# Patient Record
Sex: Male | Born: 1965
Health system: Southern US, Community
[De-identification: ages and names within clinical notes are randomized; demographics above are authoritative.]

## PROBLEM LIST (undated history)

## (undated) DIAGNOSIS — M1712 Unilateral primary osteoarthritis, left knee: Secondary | ICD-10-CM

## (undated) DIAGNOSIS — E786 Lipoprotein deficiency: Secondary | ICD-10-CM

## (undated) DIAGNOSIS — I358 Other nonrheumatic aortic valve disorders: Secondary | ICD-10-CM

## (undated) DIAGNOSIS — E119 Type 2 diabetes mellitus without complications: Secondary | ICD-10-CM

## (undated) DIAGNOSIS — E7800 Pure hypercholesterolemia, unspecified: Secondary | ICD-10-CM

## (undated) DIAGNOSIS — I10 Essential (primary) hypertension: Secondary | ICD-10-CM

## (undated) DIAGNOSIS — N529 Male erectile dysfunction, unspecified: Secondary | ICD-10-CM

## (undated) HISTORY — DX: Male erectile dysfunction, unspecified: N52.9

## (undated) HISTORY — DX: Type 2 diabetes mellitus without complications: E11.9

## (undated) HISTORY — DX: Essential (primary) hypertension: I10

## (undated) HISTORY — DX: Pure hypercholesterolemia, unspecified: E78.00

## (undated) HISTORY — DX: Lipoprotein deficiency: E78.6

## (undated) HISTORY — DX: Other nonrheumatic aortic valve disorders: I35.8

## (undated) HISTORY — DX: Unilateral primary osteoarthritis, left knee: M17.12

---

## 2008-01-19 ENCOUNTER — Ambulatory Visit: Payer: Self-pay | Admitting: Orthopedic Surgery

## 2009-10-03 ENCOUNTER — Ambulatory Visit: Payer: Self-pay | Admitting: General Practice

## 2009-10-10 ENCOUNTER — Ambulatory Visit: Payer: Self-pay | Admitting: General Practice

## 2009-10-10 IMAGING — US US EXTREM LOW VENOUS*L*
1 series · 17 of 24 positions shown · non-contrast
Comparison: none

REASON FOR EXAM: STAT CR [PHONE_NUMBER] or [PHONE_NUMBER] swollen painful Left leg
eval DVT
COMMENTS:

[Series 1: us extrem low venous*left* · 17 of 29 slices shown]
[im 1/29]
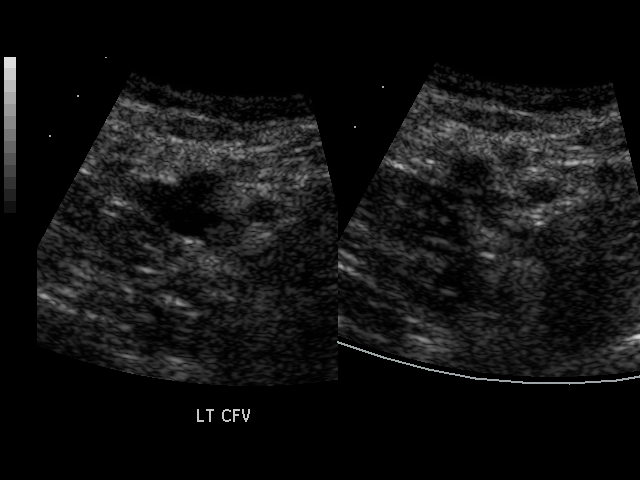
[im 3/29]
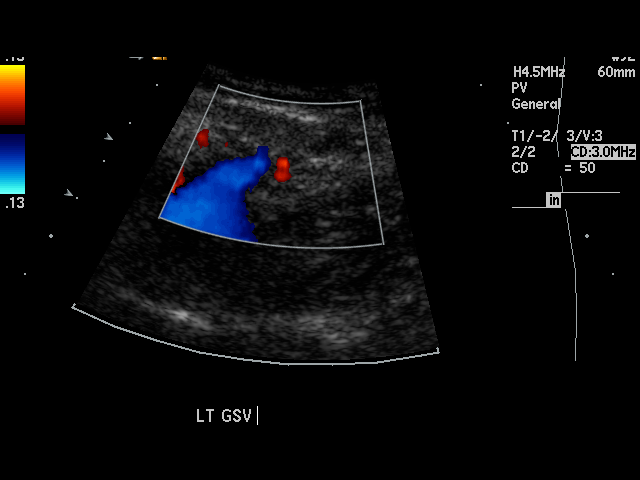
[im 4/29]
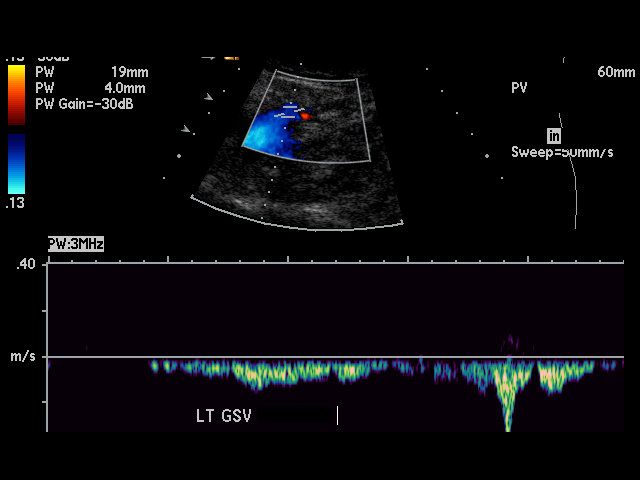
[im 5/29]
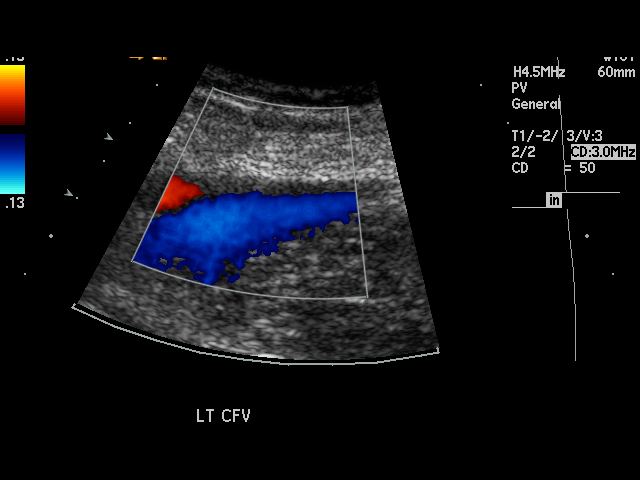
[im 8/29]
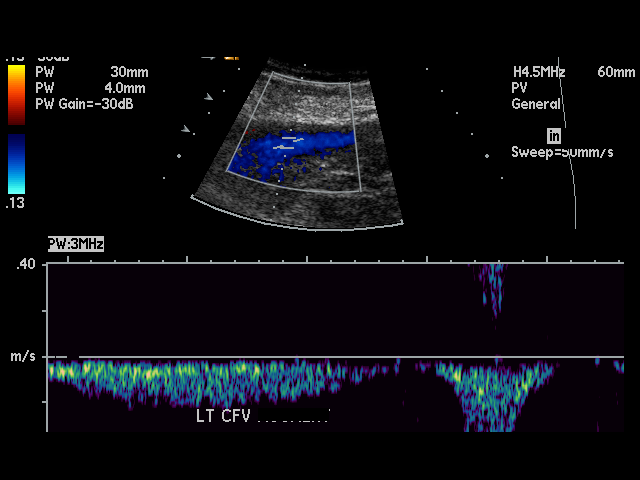
[im 9/29]
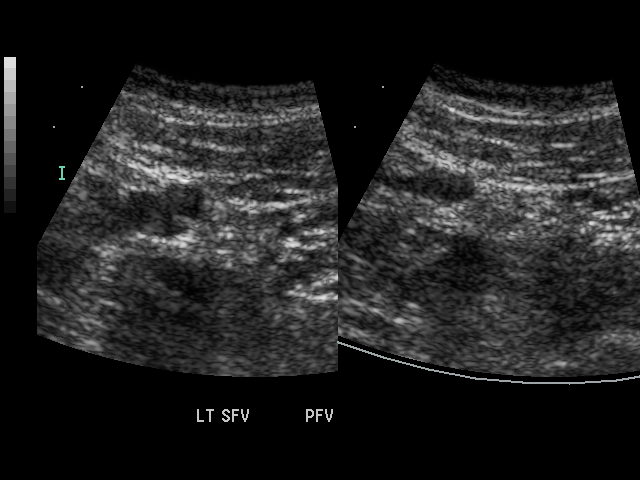
[im 11/29]
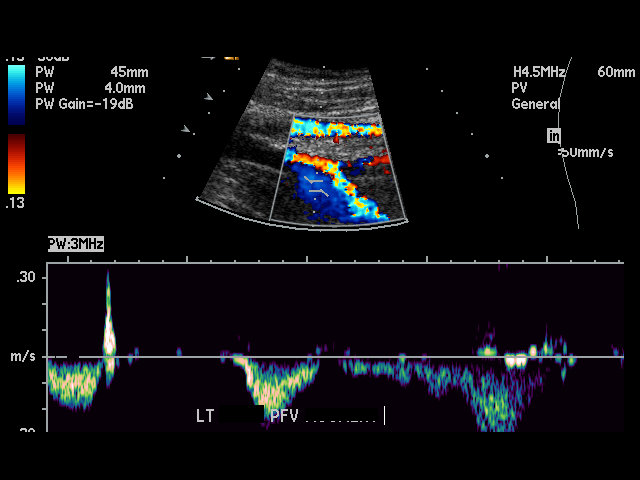
[im 13/29]
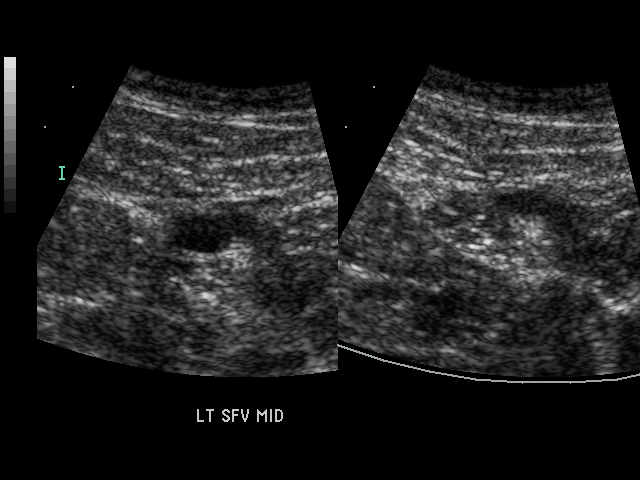
[im 15/29]
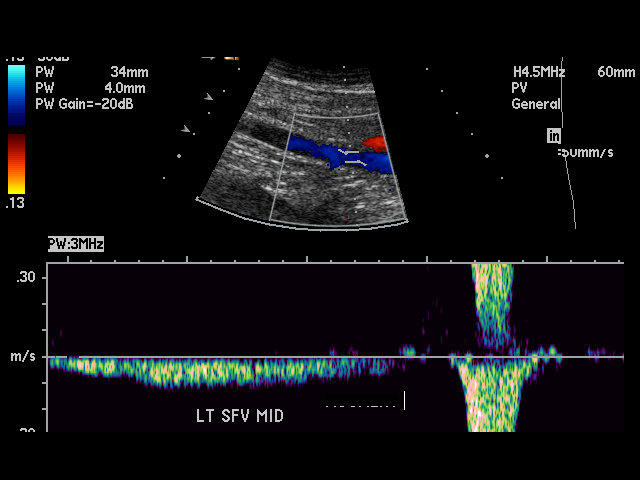
[im 16/29]
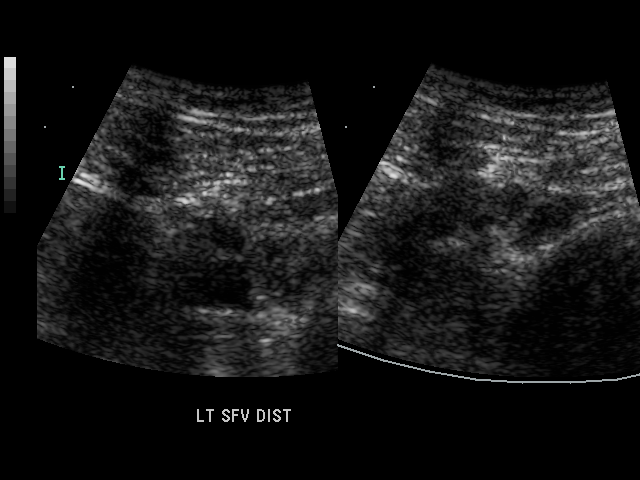
[im 18/29]
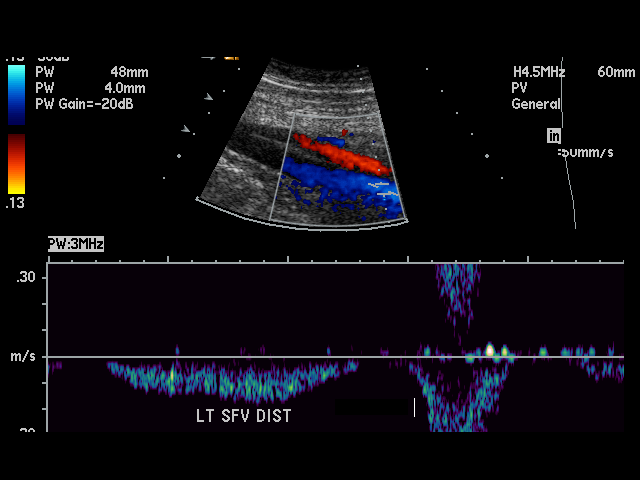
[im 20/29]
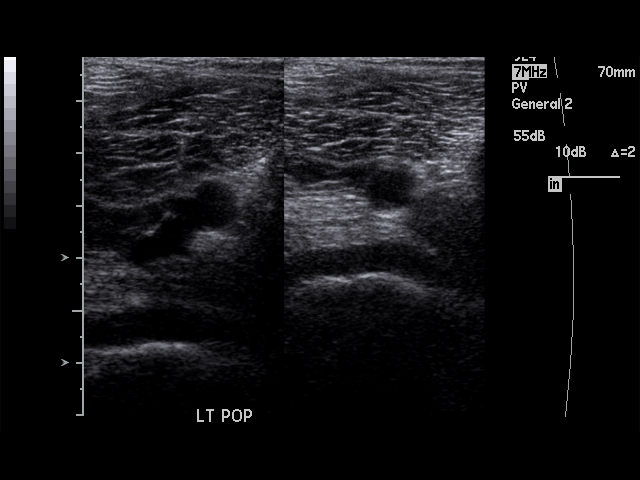
[im 21/29]
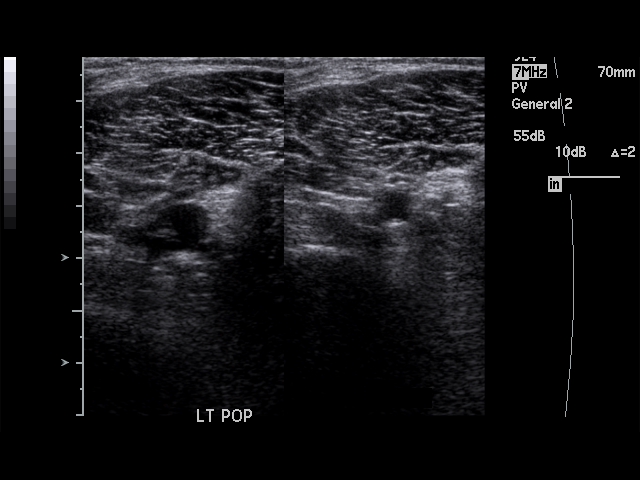
[im 24/29]
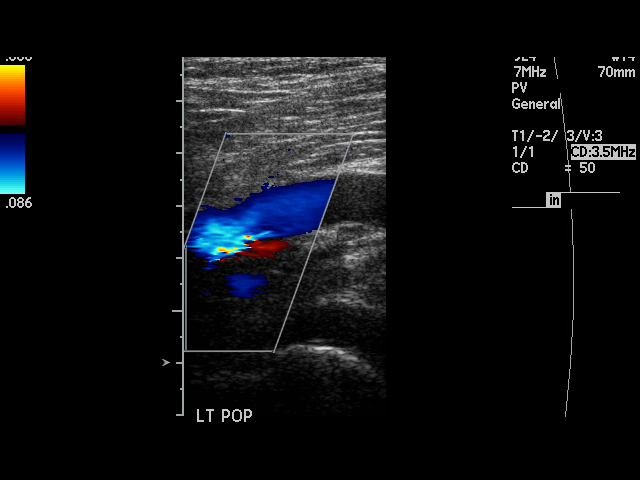
[im 25/29]
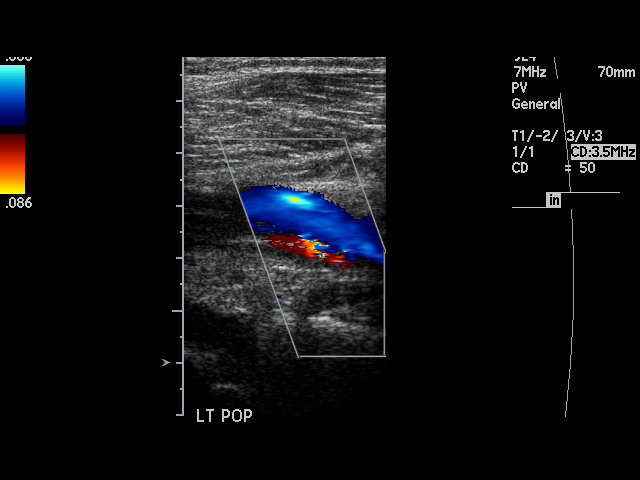
[im 26/29]
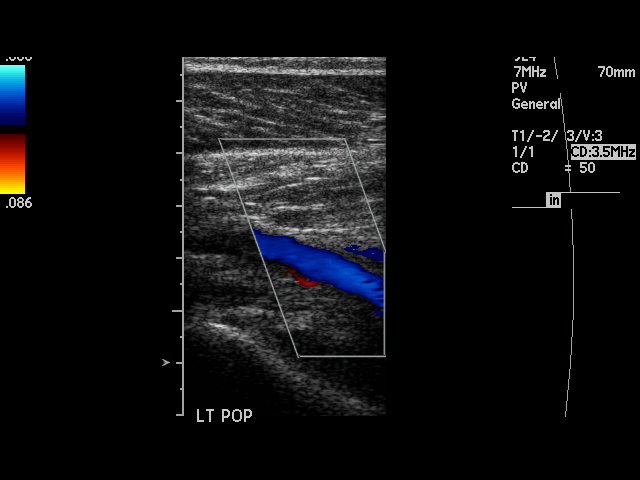
[im 29/29]
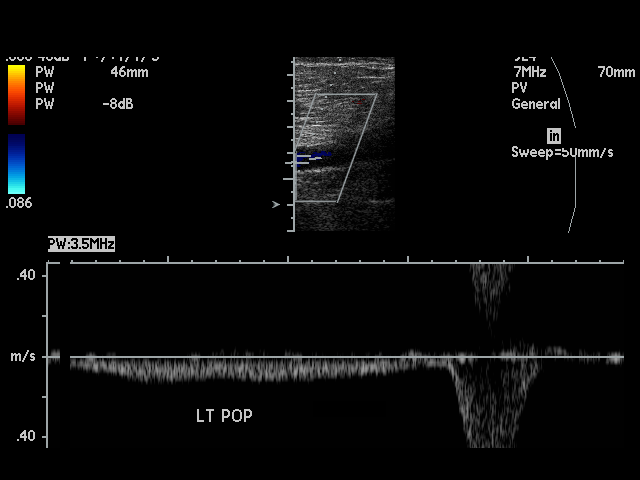

[17 of 24 positions shown; findings below may reference images not displayed]

PROCEDURE:     US  - US DOPPLER LOW EXTR LEFT  - [DATE]  [DATE]

RESULT:     The phasic, augmentation and Valsalva waveforms are normal in
appearance. The femoral and popliteal vein shows complete compressibility
throughout its course. Doppler examination shows no occlusion or evidence of
deep vein thrombosis.
IMPRESSION: No deep vein thrombosis is identified in the left leg.

## 2011-10-15 ENCOUNTER — Ambulatory Visit: Payer: Self-pay | Admitting: General Practice

## 2011-10-23 ENCOUNTER — Ambulatory Visit: Payer: Self-pay | Admitting: General Practice

## 2014-11-23 HISTORY — PX: COLONOSCOPY: SHX174

## 2014-12-04 ENCOUNTER — Encounter: Admission: RE | Payer: Self-pay | Source: Ambulatory Visit

## 2014-12-04 ENCOUNTER — Ambulatory Visit
Admission: RE | Admit: 2014-12-04 | Payer: BLUE CROSS/BLUE SHIELD | Source: Ambulatory Visit | Admitting: Gastroenterology

## 2014-12-04 SURGERY — COLONOSCOPY WITH PROPOFOL
Anesthesia: General

## 2014-12-12 ENCOUNTER — Ambulatory Visit: Admission: RE | Admit: 2014-12-12 | Payer: Self-pay | Source: Ambulatory Visit | Admitting: Gastroenterology

## 2014-12-12 LAB — HM COLONOSCOPY

## 2015-08-23 LAB — HEMOGLOBIN A1C: Hemoglobin A1C: 13.3

## 2015-08-23 LAB — LIPID PANEL
Cholesterol: 212 — AB (ref 0–200)
HDL: 32 — AB (ref 35–70)
LDL Cholesterol: 126
Triglycerides: 268 — AB (ref 40–160)

## 2015-08-23 LAB — PSA: PSA: 0.3

## 2017-08-27 LAB — LIPID PANEL
Cholesterol: 194 (ref 0–200)
HDL: 35 (ref 35–70)
LDL Cholesterol: 143
Triglycerides: 80 (ref 40–160)

## 2017-08-27 LAB — HEMOGLOBIN A1C: Hemoglobin A1C: 13.2

## 2018-02-16 LAB — BASIC METABOLIC PANEL
BUN: 18 (ref 4–21)
Creatinine: 1 (ref 0.6–1.3)

## 2018-02-16 LAB — HEMOGLOBIN A1C: Hemoglobin A1C: 11.1

## 2018-09-20 ENCOUNTER — Other Ambulatory Visit: Payer: Self-pay

## 2018-09-20 ENCOUNTER — Ambulatory Visit: Payer: BC Managed Care – PPO

## 2018-09-20 DIAGNOSIS — Z0189 Encounter for other specified special examinations: Secondary | ICD-10-CM

## 2018-09-20 LAB — POCT URINALYSIS DIPSTICK
Bilirubin, UA: NEGATIVE
Blood, UA: NEGATIVE
Glucose, UA: POSITIVE — AB
Ketones, UA: 5
Leukocytes, UA: NEGATIVE
Nitrite, UA: NEGATIVE
Protein, UA: NEGATIVE
Spec Grav, UA: 1.02 (ref 1.010–1.025)
Urobilinogen, UA: 0.2 E.U./dL
pH, UA: 6 (ref 5.0–8.0)

## 2018-09-21 LAB — CMP12+LP+TP+TSH+6AC+PSA+CBC…
ALT: 11 IU/L (ref 0–44)
AST: 12 IU/L (ref 0–40)
Albumin/Globulin Ratio: 1.6 (ref 1.2–2.2)
Albumin: 4.2 g/dL (ref 3.8–4.9)
Alkaline Phosphatase: 62 IU/L (ref 39–117)
BUN/Creatinine Ratio: 15 (ref 9–20)
BUN: 15 mg/dL (ref 6–24)
Basophils Absolute: 0 10*3/uL (ref 0.0–0.2)
Basos: 1 %
Bilirubin Total: 0.4 mg/dL (ref 0.0–1.2)
Calcium: 8.9 mg/dL (ref 8.7–10.2)
Chloride: 101 mmol/L (ref 96–106)
Chol/HDL Ratio: 8.8 ratio — ABNORMAL HIGH (ref 0.0–5.0)
Cholesterol, Total: 255 mg/dL — ABNORMAL HIGH (ref 100–199)
Creatinine, Ser: 0.97 mg/dL (ref 0.76–1.27)
Eos: 3 %
GFR calc Af Amer: 103 mL/min/{1.73_m2} (ref >59)
GFR calc non Af Amer: 89 mL/min/{1.73_m2} (ref >59)
GGT: 20 IU/L (ref 0–65)
HDL: 29 mg/dL — ABNORMAL LOW (ref >39)
Hematocrit: 43.3 % (ref 37.5–51.0)
Hemoglobin: 15.3 g/dL (ref 13.0–17.7)
Immature Grans (Abs): 0 10*3/uL (ref 0.0–0.1)
Iron: 96 ug/dL (ref 38–169)
LDH: 164 IU/L (ref 121–224)
LDL Calculated: 169 mg/dL — ABNORMAL HIGH (ref 0–99)
Lymphocytes Absolute: 1.6 10*3/uL (ref 0.7–3.1)
MCH: 29.5 pg (ref 26.6–33.0)
MCHC: 35.3 g/dL (ref 31.5–35.7)
MCV: 84 fL (ref 79–97)
Monocytes Absolute: 0.4 10*3/uL (ref 0.1–0.9)
Monocytes: 9 %
Neutrophils Absolute: 1.7 10*3/uL (ref 1.4–7.0)
Neutrophils: 44 %
Phosphorus: 3 mg/dL (ref 2.8–4.1)
Platelets: 226 10*3/uL (ref 150–450)
Potassium: 4.3 mmol/L (ref 3.5–5.2)
Prostate Specific Ag, Serum: 0.3 ng/mL (ref 0.0–4.0)
RBC: 5.18 x10E6/uL (ref 4.14–5.80)
T3 Uptake Ratio: 30 % (ref 24–39)
T4, Total: 6.2 ug/dL (ref 4.5–12.0)
TSH: 1.21 u[IU]/mL (ref 0.450–4.500)
Triglycerides: 284 mg/dL — ABNORMAL HIGH (ref 0–149)
Uric Acid: 6.3 mg/dL (ref 3.7–8.6)
WBC: 3.8 10*3/uL (ref 3.4–10.8)

## 2018-09-21 LAB — HGB A1C W/O EAG: Hgb A1c MFr Bld: 13.5 % — ABNORMAL HIGH (ref 4.8–5.6)

## 2018-09-21 LAB — CMP12+LP+TP+TSH+6AC+PSA+CBC?
EOS (ABSOLUTE): 0.1 10*3/uL (ref 0.0–0.4)
Estimated CHD Risk: 1.8 times avg. — ABNORMAL HIGH (ref 0.0–1.0)
Free Thyroxine Index: 1.9 (ref 1.2–4.9)
Globulin, Total: 2.7 g/dL (ref 1.5–4.5)
Glucose: 309 mg/dL — ABNORMAL HIGH (ref 65–99)
Immature Granulocytes: 0 %
Lymphs: 43 %
RDW: 13.2 % (ref 11.6–15.4)
Sodium: 140 mmol/L (ref 134–144)
Total Protein: 6.9 g/dL (ref 6.0–8.5)
VLDL Cholesterol Cal: 57 mg/dL — ABNORMAL HIGH (ref 5–40)

## 2018-09-21 LAB — MICROALBUMIN / CREATININE URINE RATIO
Creatinine, Urine: 91.9 mg/dL
Microalb/Creat Ratio: 4 mg/g creat (ref 0–29)
Microalbumin, Urine: 4 ug/mL

## 2018-09-23 LAB — TESTOSTERONE: Testosterone: 318

## 2018-09-27 ENCOUNTER — Encounter: Payer: Self-pay | Admitting: Internal Medicine

## 2018-09-29 ENCOUNTER — Encounter: Payer: Self-pay | Admitting: Internal Medicine

## 2018-09-29 ENCOUNTER — Other Ambulatory Visit: Payer: Self-pay

## 2018-09-29 ENCOUNTER — Ambulatory Visit: Payer: Self-pay | Admitting: Internal Medicine

## 2018-09-29 VITALS — BP 127/95 | HR 85 | Temp 97.8°F | Resp 16 | Ht 73.0 in | Wt 186.0 lb

## 2018-09-29 DIAGNOSIS — E7849 Other hyperlipidemia: Secondary | ICD-10-CM

## 2018-09-29 DIAGNOSIS — E1165 Type 2 diabetes mellitus with hyperglycemia: Secondary | ICD-10-CM

## 2018-09-29 DIAGNOSIS — Z794 Long term (current) use of insulin: Secondary | ICD-10-CM

## 2018-09-29 DIAGNOSIS — R9431 Abnormal electrocardiogram [ECG] [EKG]: Secondary | ICD-10-CM

## 2018-09-29 DIAGNOSIS — Z9119 Patient's noncompliance with other medical treatment and regimen: Secondary | ICD-10-CM

## 2018-09-29 DIAGNOSIS — I10 Essential (primary) hypertension: Secondary | ICD-10-CM

## 2018-09-29 DIAGNOSIS — Z91199 Patient's noncompliance with other medical treatment and regimen due to unspecified reason: Secondary | ICD-10-CM

## 2018-09-29 MED ORDER — ATORVASTATIN CALCIUM 40 MG PO TABS: 40.0000 mg | ORAL_TABLET | Freq: Every day | ORAL | 3 refills | Status: DC

## 2018-09-29 NOTE — Patient Instructions (Signed)
You have been referred to Endocrinology for your diabetes, they will contact you about an appointment. There contact information is [facility] [phone].

## 2018-09-29 NOTE — Progress Notes (Signed)
Oscar Schultz  - 53 yo male who presents for annual physical evaluation Long h/o noncompliance noted in the chart with respect to his DM management. He had an A1C of 15.8 in May, 2013, was 14 in June 2014, 14.9 in March 2017, 13.2 in June 2019, 11.1 in November 2019, and 13.5 on most recent check 09/20/2018. In Dec 2019, the PA who was here in the [facility] clinic referred the patient to endocrine and he did not go (fearful). He also has a h/o HTN, with recent BP'Oscar Schultz not well controlled and hyperlipidemia with LDL'Oscar Schultz well above goals.  No specific complaints, denies any recent CP, palpitations, SOB, abdominal pains, change in bowel habits, vision changes (did get a reminder for his eye exam and he noted he will try to see them next week), no recent fevers or other Covid sx'Oscar Schultz of concern, is peeing more in recent past, denies marked fatigue  Exercise - no regular workouts Diet - not adherent to a strict diet, does drink a lot of sugar free gatorade Meds reviewed - admits misses doses often and has not taken his BP meds the last two days, maybe longer, thinks these are the meds taking presently  Current Outpatient Medications on File Prior to Visit  Medication Sig Dispense Refill  . amLODipine (NORVASC) 10 MG tablet Take 10 mg by mouth daily.    Marland Kitchen aspirin 81 MG chewable tablet Chew by mouth.    Marland Kitchen FARXIGA 10 MG TABS tablet Take 10 mg by mouth daily.    Marland Kitchen LANTUS SOLOSTAR 100 UNIT/ML Solostar Pen INJECT 10 UNITS SUBCUTANEOUSLY AT BEDTIME    . lisinopril (PRINIVIL,ZESTRIL) 40 MG tablet Take 40 mg by mouth daily.    Marland Kitchen OZEMPIC, 0.25 OR 0.5 MG/DOSE, 2 MG/1.5ML SOPN INJECT 0.5 ONCE A WEEK     No current facility-administered medications on file prior to visit.     No Known Allergies   Social History   Tobacco Use  Smoking Status Never Smoker  Smokeless Tobacco Never Used    FH - Oscar Schultz - DM, B - CAD, MI, M and F deceased - + HD  O - NAD, masked  BP (!) 127/95 (BP Location: Right Arm, Patient Position:  Sitting, Cuff Size: Large)   Pulse 85   Temp 97.8 F (36.6 C) (Oral)   Resp 16   Ht 6\' 1"  (1.854 m)   Wt 186 lb (84.4 kg)   SpO2 98%   BMI 24.54 kg/m   HEENT - muddy sclera, + glasses,  PERRL, EOMI, conj - non-inj'ed,  Neck - supple, no adenopathy, no TM, carotids without bruits Car - RRR without m/g/r Pulm- CTA without wheeze or rales Abd - soft, NT, ND, BS+, no obvious HSM, no masses Back - no CVA tenderness Ext - no LE edema, no active joints GU - testicle and prostate exams deferred (denied any increased concerning sx'Oscar Schultz and after discussion on current recommendations for prostate CA screening including PSA tests) Neuro - affect was not flat, appropriate with conversation  Grossly non-focal with good strength on testing, sensation intact to LT in distal extremities, DTR'Oscar Schultz 2+ and = patella, Romberg neg, no pronator drift, good balance on one foot, good finger to nose   Labs reviewed - A1C - 13.5, LDL - 169, TC-255, glucose - 309, PSA-0.3 ECG reviewed - no concerning changes from prior ECG, Noted SR, HR-81, marked repol changes of concern with LVH by voltage criteria, RSR' in V2.  Colonoscopy 2016, 10 year f/u after rec'ed  Had stress test done 2007  Ass/Plan 1. DM - poorly controlled and Lantus qhs added June 2019 by prior provider. Remains on Ozempic and Farxiga by history. See AIC'Oscar Schultz dating back to 2013 with poor control. Non-compliance a big issue  Endocrine referral needed and he agreed to go and will order and importance of going and compliance in the future emphasized today.   Diet also critical in addition to meds noted and hoping endo can help further to emphasize  2. Hyperlipidemia  - LDL goal <70.   Started Lipitor 40mg  daily (had been on in the past and not sure why stopped)  Diet and exercise importance with this also noted  3. HTN - BP not well controlled, but not take medicines last couple days, possibly longer.   Not increase or change meds noting the  non-compliance and emphasized taking his meds and not missing days. Then will recheck once back on medications to assess.  4. Non-compliance - emphasized importance of this and getting his sugars, BP'Oscar Schultz and lipids better controlled to help protect his heart, kidneys and other organs over time. He cannot wait longer to do so.   5. Next colonoscopy due 2026  6. Abnormal ECG - no acute changes noted and no concerning change from prior ECG - noted my concerns and getting cardiology involved again and agreed to first see endocrine very soon and then f/u with us after and then likely pursue cardiology input.  Any CP'Oscar Schultz, palpitations, SOB, passing out feelings, needs to go the ER and be seen immediately and he understood.

## 2018-10-21 ENCOUNTER — Other Ambulatory Visit: Payer: Self-pay | Admitting: Internal Medicine

## 2018-10-21 ENCOUNTER — Other Ambulatory Visit: Payer: Self-pay

## 2018-10-21 MED ORDER — FARXIGA 10 MG PO TABS: 10.0000 mg | ORAL_TABLET | Freq: Every day | ORAL | 1 refills | Status: DC

## 2018-10-21 NOTE — Telephone Encounter (Signed)
Informed appt in August with endocrine by Mearl Latin who called to find out.

## 2018-10-22 ENCOUNTER — Other Ambulatory Visit: Payer: Self-pay

## 2018-10-22 MED ORDER — LISINOPRIL 40 MG PO TABS: 40.0000 mg | ORAL_TABLET | Freq: Every day | ORAL | 3 refills | Status: DC

## 2018-10-22 MED ORDER — AMLODIPINE BESYLATE 10 MG PO TABS: 10.0000 mg | ORAL_TABLET | Freq: Every day | ORAL | 3 refills | Status: DC

## 2018-10-22 NOTE — Addendum Note (Signed)
Addended by: Virgilio Frees on: 10/22/2018 11:00 AM   Modules accepted: Orders

## 2018-10-22 NOTE — Telephone Encounter (Signed)
Please contact this pharmacy. I refilled the farxiga prescription for 30 days with one refill as the patient has an appointment with endocrine in August (after you confirmed by phone yesterday) and he needs to keep that. Await their recommendations with medicines and this one in particular before filling for longer period of time.  Thanks,

## 2018-10-22 NOTE — Telephone Encounter (Signed)
Pt went to pharm asking for lisinopril and amlodipine and they have never filled before and need new rx for these.

## 2018-10-22 NOTE — Telephone Encounter (Signed)
Have asked Mearl Latin to call pharmacy to clarify prescription order.

## 2018-11-07 DIAGNOSIS — Z794 Long term (current) use of insulin: Secondary | ICD-10-CM | POA: Insufficient documentation

## 2018-11-07 DIAGNOSIS — E1165 Type 2 diabetes mellitus with hyperglycemia: Secondary | ICD-10-CM | POA: Insufficient documentation

## 2018-11-07 DIAGNOSIS — E782 Mixed hyperlipidemia: Secondary | ICD-10-CM | POA: Insufficient documentation

## 2018-11-07 DIAGNOSIS — I10 Essential (primary) hypertension: Secondary | ICD-10-CM | POA: Insufficient documentation

## 2018-11-08 DIAGNOSIS — E1165 Type 2 diabetes mellitus with hyperglycemia: Secondary | ICD-10-CM | POA: Diagnosis not present

## 2018-11-08 DIAGNOSIS — I10 Essential (primary) hypertension: Secondary | ICD-10-CM | POA: Diagnosis not present

## 2018-11-08 DIAGNOSIS — Z794 Long term (current) use of insulin: Secondary | ICD-10-CM | POA: Diagnosis not present

## 2018-11-08 DIAGNOSIS — E782 Mixed hyperlipidemia: Secondary | ICD-10-CM | POA: Diagnosis not present

## 2018-12-02 DIAGNOSIS — E1039 Type 1 diabetes mellitus with other diabetic ophthalmic complication: Secondary | ICD-10-CM | POA: Diagnosis not present

## 2018-12-02 DIAGNOSIS — E113393 Type 2 diabetes mellitus with moderate nonproliferative diabetic retinopathy without macular edema, bilateral: Secondary | ICD-10-CM | POA: Diagnosis not present

## 2018-12-02 DIAGNOSIS — I10 Essential (primary) hypertension: Secondary | ICD-10-CM | POA: Diagnosis not present

## 2018-12-02 DIAGNOSIS — H35033 Hypertensive retinopathy, bilateral: Secondary | ICD-10-CM | POA: Diagnosis not present

## 2018-12-03 ENCOUNTER — Ambulatory Visit: Payer: BLUE CROSS/BLUE SHIELD | Admitting: *Deleted

## 2018-12-10 ENCOUNTER — Encounter: Payer: 59 | Attending: Surgery | Admitting: *Deleted

## 2018-12-10 ENCOUNTER — Encounter: Payer: Self-pay | Admitting: *Deleted

## 2018-12-10 ENCOUNTER — Other Ambulatory Visit: Payer: Self-pay

## 2018-12-10 VITALS — BP 120/80 | Ht 73.5 in | Wt 185.1 lb

## 2018-12-10 DIAGNOSIS — Z794 Long term (current) use of insulin: Secondary | ICD-10-CM | POA: Insufficient documentation

## 2018-12-10 DIAGNOSIS — E1165 Type 2 diabetes mellitus with hyperglycemia: Secondary | ICD-10-CM | POA: Insufficient documentation

## 2018-12-10 DIAGNOSIS — E119 Type 2 diabetes mellitus without complications: Secondary | ICD-10-CM

## 2018-12-10 NOTE — Progress Notes (Signed)
Diabetes Self-Management Education  Visit Type: First/Initial  Appt. Start Time: 0910 Appt. End Time: 1020  12/10/2018  [name], identified by name and date of birth, is a 53 y.o. male with a diagnosis of Diabetes: Type 2.   ASSESSMENT  Blood pressure 120/80, height 6' 1.5" (1.867 m), weight 185 lb 1.6 oz (84 kg). Body mass index is 24.09 kg/m.  Diabetes Self-Management Education - 12/10/18 1037      Visit Information   Visit Type  First/Initial      Initial Visit   Diabetes Type  Type 2    Are you currently following a meal plan?  Yes    What type of meal plan do you follow?  "eating less"    Are you taking your medications as prescribed?  No   sometimes forgets medications but reports that he remembers his insulin   Date Diagnosed  17 years ago      Health Coping   How would you rate your overall health?  Fair      Psychosocial Assessment   Patient Belief/Attitude about Diabetes  Motivated to manage diabetes   "unexplainable"   Self-care barriers  None    Self-management support  Doctor's office;Friends;Family    Patient Concerns  Nutrition/Meal planning;Glycemic Control;Medication;Monitoring;Weight Control;Healthy Lifestyle    Special Needs  None    Preferred Learning Style  Auditory;Hands on    Learning Readiness  Change in progress    How often do you need to have someone help you when you read instructions, pamphlets, or other written materials from your doctor or pharmacy?  1 - Never    What is the last grade level you completed in school?  12      Pre-Education Assessment   Patient understands the diabetes disease and treatment process.  Needs Instruction    Patient understands incorporating nutritional management into lifestyle.  Needs Instruction    Patient undertands incorporating physical activity into lifestyle.  Needs Instruction    Patient understands using medications safely.  Needs Review    Patient understands monitoring blood glucose,  interpreting and using results  Needs Review    Patient understands prevention, detection, and treatment of acute complications.  Needs Instruction    Patient understands prevention, detection, and treatment of chronic complications.  Needs Instruction    Patient understands how to develop strategies to address psychosocial issues.  Needs Instruction    Patient understands how to develop strategies to promote health/change behavior.  Needs Instruction      Complications   Last HgB A1C per patient/outside source  13.5 %   09/20/2018   How often do you check your blood sugar?  1-2 times/day    Fasting Blood glucose range (mg/dL)  130-179   Pt reports FBG's 150-160's mg/dL.   Postprandial Blood glucose range (mg/dL)  180-200;>200   Pt reports pp's 180-215 mg/dL.   Have you had a dilated eye exam in the past 12 months?  Yes    Have you had a dental exam in the past 12 months?  Yes    Are you checking your feet?  Yes    How many days per week are you checking your feet?  3      Dietary Intake   Breakfast  2 sandwiches of fried bologna and egg; grilled chicken biscuit and oatmeal    Snack (morning)  peanut butter crackers    Lunch  2 [place] and cheese sandwiches; sometimes some fruit (pineapple)  Dinner  chicken, pork chop, beef with french fries, peas, beans, corn, rice, pasta, green beans, greens, broccoli, cauliflower    Snack (evening)  crackers, popcorn    Beverage(s)  water, coffee, diet soda, diet green tea, Gatorade zero      Exercise   Exercise Type  ADL's      Patient Education   Previous Diabetes Education  No    Disease state   Definition of diabetes, type 1 and 2, and the diagnosis of diabetes;Factors that contribute to the development of diabetes    Nutrition management   Role of diet in the treatment of diabetes and the relationship between the three main macronutrients and blood glucose level;Food label reading, portion sizes and measuring food.;Reviewed blood glucose  goals for pre and post meals and how to evaluate the patients' food intake on their blood glucose level.;Effects of alcohol on blood glucose and safety factors with consumption of alcohol.    Physical activity and exercise   Role of exercise on diabetes management, blood pressure control and cardiac health.    Medications  Taught/reviewed insulin injection, site rotation, insulin storage and needle disposal.;Reviewed patients medication for diabetes, action, purpose, timing of dose and side effects.    Monitoring  Purpose and frequency of SMBG.;Taught/discussed recording of test results and interpretation of SMBG.;Identified appropriate SMBG and/or A1C goals.    Acute complications  Taught treatment of hypoglycemia - the 15 rule.    Chronic complications  Relationship between chronic complications and blood glucose control      Individualized Goals (developed by patient)   Reducing Risk  Improve blood sugars Decrease medications Prevent diabetes complications Lose weight Lead a healthier lifestyle Become more fit     Outcomes   Expected Outcomes  Demonstrated interest in learning. Expect positive outcomes       Individualized Plan for Diabetes Self-Management Training:   Learning Objective:  Patient will have a greater understanding of diabetes self-management. Patient education plan is to attend individual and/or group sessions per assessed needs and concerns.   Plan:   Patient Instructions  Check blood sugars 2 x day before breakfast and 2 hrs after supper every day Bring blood sugar records to the next class Exercise: Begin walking  for   15  minutes  3  days a week and gradually increase to 30 minutes 5 x week Eat 3 meals day,   1-2  snacks a day Space meals 4-6 hours apart Limit fried foods Carry fast acting glucose and a snack at all times Rotate injection sites  Expected Outcomes:  Demonstrated interest in learning. Expect positive outcomes  Education material provided:   General Meal Planning Guidelines Simple Meal Plan Glucose tablets Symptoms, causes and treatments of Hypoglycemia  If problems or questions, patient to contact team via:  Sharion SettlerSheila , RN, CCM, CDE [phone]  Future DSME appointment:  December 13, 2018 for Diabetes Class 1

## 2018-12-10 NOTE — Patient Instructions (Addendum)
Check blood sugars 2 x day before breakfast and 2 hrs after supper every day Bring blood sugar records to the next class  Exercise: Begin walking  for   15  minutes  3  days a week and gradually increase to 30 minutes 5 x week  Eat 3 meals day,   1-2  snacks a day Space meals 4-6 hours apart Limit fried foods  Carry fast acting glucose and a snack at all times Rotate injection sites  Return for classes on:

## 2018-12-13 ENCOUNTER — Other Ambulatory Visit: Payer: Self-pay

## 2018-12-13 ENCOUNTER — Encounter: Payer: Self-pay | Admitting: Dietician

## 2018-12-13 DIAGNOSIS — E1165 Type 2 diabetes mellitus with hyperglycemia: Secondary | ICD-10-CM | POA: Diagnosis not present

## 2018-12-13 DIAGNOSIS — E782 Mixed hyperlipidemia: Secondary | ICD-10-CM | POA: Diagnosis not present

## 2018-12-13 DIAGNOSIS — Z794 Long term (current) use of insulin: Secondary | ICD-10-CM | POA: Diagnosis not present

## 2018-12-13 DIAGNOSIS — I10 Essential (primary) hypertension: Secondary | ICD-10-CM | POA: Diagnosis not present

## 2018-12-13 NOTE — Progress Notes (Signed)
Patient did not attend diabetes class 1 as scheduled for today. Will contact patient to reschedule the class series.

## 2018-12-14 ENCOUNTER — Telehealth: Payer: Self-pay | Admitting: *Deleted

## 2018-12-14 NOTE — Telephone Encounter (Signed)
Received voice mail yesterday from patient that he wasn't able to make last night's diabetes class. Call him and rescheduled for Oct 19 series.

## 2018-12-18 ENCOUNTER — Other Ambulatory Visit: Payer: Self-pay | Admitting: Internal Medicine

## 2018-12-18 DIAGNOSIS — E1165 Type 2 diabetes mellitus with hyperglycemia: Secondary | ICD-10-CM

## 2018-12-20 ENCOUNTER — Encounter: Payer: Self-pay | Admitting: Internal Medicine

## 2018-12-20 ENCOUNTER — Ambulatory Visit: Payer: 59

## 2018-12-20 NOTE — Telephone Encounter (Signed)
Saw endocrinology 13 Dec 2018 lantus dose increased to 15 units nightly from 10 units and to continue farxiga 10mg  po daily.  HgbA1c due next month.  Last Jun 2020 13.1.  Saw optometry 12/13/2018 also.  Next endocrinology appt 01/27/2019.

## 2018-12-27 ENCOUNTER — Ambulatory Visit: Payer: 59

## 2019-01-07 DIAGNOSIS — H35033 Hypertensive retinopathy, bilateral: Secondary | ICD-10-CM | POA: Diagnosis not present

## 2019-01-07 DIAGNOSIS — E103312 Type 1 diabetes mellitus with moderate nonproliferative diabetic retinopathy with macular edema, left eye: Secondary | ICD-10-CM | POA: Diagnosis not present

## 2019-01-07 DIAGNOSIS — H43823 Vitreomacular adhesion, bilateral: Secondary | ICD-10-CM | POA: Diagnosis not present

## 2019-01-07 DIAGNOSIS — E103391 Type 1 diabetes mellitus with moderate nonproliferative diabetic retinopathy without macular edema, right eye: Secondary | ICD-10-CM | POA: Diagnosis not present

## 2019-01-10 ENCOUNTER — Other Ambulatory Visit: Payer: Self-pay

## 2019-01-10 ENCOUNTER — Encounter: Payer: 59 | Attending: Surgery | Admitting: Dietician

## 2019-01-10 ENCOUNTER — Encounter: Payer: Self-pay | Admitting: Dietician

## 2019-01-10 VITALS — Ht 73.5 in | Wt 191.1 lb

## 2019-01-10 DIAGNOSIS — E1165 Type 2 diabetes mellitus with hyperglycemia: Secondary | ICD-10-CM | POA: Diagnosis not present

## 2019-01-10 DIAGNOSIS — E119 Type 2 diabetes mellitus without complications: Secondary | ICD-10-CM

## 2019-01-10 DIAGNOSIS — Z794 Long term (current) use of insulin: Secondary | ICD-10-CM | POA: Diagnosis not present

## 2019-01-10 NOTE — Progress Notes (Signed)
Appt. Start Time: 1730 Appt. End Time: 2000  Class 1 Diabetes Overview - define DM; state own type of DM; identify functions of pancreas and insulin; define insulin deficiency vs insulin resistance  Psychosocial - identify DM as a source of stress; state the effects of stress on BG control  Nutritional Management - describe effects of food on blood glucose; identify sources of carbohydrate, protein and fat; verbalize the importance of balance meals in controlling blood glucose  Exercise - describe the effects of exercise on blood glucose and importance of regular exercise in controlling diabetes; state a plan for personal exercise; verbalize contraindications for exercise  Self-Monitoring - state importance of SMBG; use SMBG results to effectively manage diabetes; identify importance of regular HbA1C testing and goals for results  Acute Complications - recognize hyperglycemia and hypoglycemia with causes and effects; identify blood glucose results as high, low or in control; list steps in treating and preventing high and low blood glucose  Chronic Complications/Foot, Skin, Eye Dental Care - identify possible long-term complications of diabetes (retinopathy, neuropathy, nephropathy, cardiovascular disease, infections); state importance of daily self-foot exams; describe how to examine feet and what to look for; explain appropriate eye and dental care  Lifestyle Changes/Goals & Health/Community Resources - state benefits of making appropriate lifestyle changes; identify habits that need to change (meals, tobacco, alcohol); identify strategies to reduce risk factors for personal health  Pregnancy/Sexual Health - define gestational diabetes; state importance of good blood glucose control and birth control prior to pregnancy; state importance of good blood glucose control in preventing sexual problems (impotence, vaginal dryness, infections, loss of desire); state relationship of blood glucose control  and pregnancy outcome; describe risk of maternal and fetal complications  Teaching Materials Used: Class 1 Slides/Notebook Diabetes Booklet ID Card  Medic Alert/Medic ID Forms Sleep Evaluation Exercise Handout Planning a Balanced Meal Goals for Class 1  

## 2019-01-12 ENCOUNTER — Other Ambulatory Visit: Payer: Self-pay

## 2019-01-14 ENCOUNTER — Ambulatory Visit: Payer: BC Managed Care – PPO

## 2019-01-14 ENCOUNTER — Other Ambulatory Visit: Payer: Self-pay

## 2019-01-14 DIAGNOSIS — Z9229 Personal history of other drug therapy: Secondary | ICD-10-CM

## 2019-01-17 ENCOUNTER — Other Ambulatory Visit: Payer: Self-pay

## 2019-01-17 ENCOUNTER — Encounter: Payer: Self-pay | Admitting: Dietician

## 2019-01-17 NOTE — Progress Notes (Signed)
Pt left message he cannot come to  class 2 today. Called pt-rescheduled classes 2 & 3 on 02-07-19 and 02-14-19

## 2019-01-24 ENCOUNTER — Ambulatory Visit: Payer: 59

## 2019-01-27 DIAGNOSIS — I10 Essential (primary) hypertension: Secondary | ICD-10-CM | POA: Diagnosis not present

## 2019-01-27 DIAGNOSIS — Z794 Long term (current) use of insulin: Secondary | ICD-10-CM | POA: Diagnosis not present

## 2019-01-27 DIAGNOSIS — E1165 Type 2 diabetes mellitus with hyperglycemia: Secondary | ICD-10-CM | POA: Diagnosis not present

## 2019-01-27 DIAGNOSIS — E782 Mixed hyperlipidemia: Secondary | ICD-10-CM | POA: Diagnosis not present

## 2019-02-07 ENCOUNTER — Encounter: Payer: BC Managed Care – PPO | Attending: Surgery | Admitting: Dietician

## 2019-02-07 ENCOUNTER — Other Ambulatory Visit: Payer: Self-pay

## 2019-02-07 ENCOUNTER — Encounter: Payer: Self-pay | Admitting: Dietician

## 2019-02-07 VITALS — Ht 73.5 in | Wt 188.5 lb

## 2019-02-07 DIAGNOSIS — E1165 Type 2 diabetes mellitus with hyperglycemia: Secondary | ICD-10-CM | POA: Insufficient documentation

## 2019-02-07 DIAGNOSIS — Z794 Long term (current) use of insulin: Secondary | ICD-10-CM | POA: Insufficient documentation

## 2019-02-07 DIAGNOSIS — E119 Type 2 diabetes mellitus without complications: Secondary | ICD-10-CM

## 2019-02-07 NOTE — Progress Notes (Signed)
Appt. Start Time: 1740 Appt. End Time: 2030  Class 2 Nutritional Management - identify sources of carbohydrate, protein and fat; plan balanced meals; estimate servings of carbohydrates in meals  Psychosocial - identify DM as a source of stress; state the effects of stress on BG control  Exercise - describe the effects of exercise on blood glucose and importance of regular exercise in controlling diabetes; state a plan for personal exercise; verbalize contraindications for exercise  Self-Monitoring - state importance of SMBG; use SMBG results to effectively manage diabetes; identify importance of regular HbA1C testing and goals for results  Acute Complications - recognize hyperglycemia and hypoglycemia with causes and effects; identify blood glucose results as high, low or in control; list steps in treating and preventing high and low blood glucose  Sick Day Guidelines: state appropriate measure to manage blood glucose when ill (need for meds, HBGM plan, when to call physician, need for fluids)  Chronic Complications/Foot, Skin, Eye Dental Care - identify possible long-term complications of diabetes (retinopathy, neuropathy, nephropathy, cardiovascular disease, infections); explain steps in prevention and treatment of chronic complications; state importance of daily self-foot exams; describe how to examine feet and what to look for; explain appropriate eye and dental care  Lifestyle Changes/Goals - state benefits of making appropriate lifestyle changes; identify habits that need to change (meals, tobacco, alcohol); identify strategies to reduce risk factors for personal health  Pregnancy/Sexual Health - state importance of good blood glucose control in preventing sexual problems (impotence, vaginal dryness, infections, loss of desire)  Teaching Materials Used: Class 2 Slide Packet A1C Pamphlet Foot Care Literature Kidney Test Handout Stroke Card Quick and "Balanced" Meal Ideas Carb  Counting and Meal Planning Book Goals for Class 2

## 2019-02-14 ENCOUNTER — Encounter: Payer: BC Managed Care – PPO | Admitting: Dietician

## 2019-02-14 ENCOUNTER — Other Ambulatory Visit: Payer: Self-pay

## 2019-02-14 VITALS — BP 144/92 | Ht 73.5 in | Wt 188.9 lb

## 2019-02-14 DIAGNOSIS — Z794 Long term (current) use of insulin: Secondary | ICD-10-CM

## 2019-02-14 DIAGNOSIS — E119 Type 2 diabetes mellitus without complications: Secondary | ICD-10-CM

## 2019-02-14 DIAGNOSIS — E1165 Type 2 diabetes mellitus with hyperglycemia: Secondary | ICD-10-CM | POA: Diagnosis not present

## 2019-02-14 NOTE — Progress Notes (Signed)

## 2019-02-15 ENCOUNTER — Encounter: Payer: Self-pay | Admitting: *Deleted

## 2019-07-18 ENCOUNTER — Other Ambulatory Visit: Payer: Self-pay | Admitting: *Deleted

## 2019-07-18 NOTE — Patient Outreach (Signed)
[facility]) Care Management  07/18/2019  Oscar Schultz [birth year] [id number]   Referral received from care management assistant to screen member for [employer] HTN initiative.  Call placed to member, no answer, HIPAA compliant voice message left.  Will send unsuccessful outreach letter and follow up within the next 3-4 business days.  [name], [place], MSN [facility] Care Management  [facility] Manager [phone]

## 2019-07-21 ENCOUNTER — Other Ambulatory Visit: Payer: Self-pay | Admitting: *Deleted

## 2019-07-21 NOTE — Patient Outreach (Signed)
[facility]) Care Management  07/21/2019  Oscar Schultz 04/20/1965 [id number]   Referral Date: 4/26 Referral Source: Insurance Referral Reason: HTN initiative Insurance: [employer] attempt #2, successful, identity verified.  This care manager introduced self and stated purpose of call.  [facility] care management services and HTN initiative program explained.   Social: Lives with family but report he is independent with all daily needs.  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   Conditions: Unable to find documented history in chart or [facility], but has history of HTN per insurance and history of DM per labs in [facility] (A1C - 13.5 in June 2020).  Member report he is taking insulin and has CBG meter, state readings have been "good" although he does not give specific numbers.  Encouraged to check blood sugar at least once daily and record readings.  Medications: Reviewed, member denies concern with paying for medication, denies concern for management.  Appointments: Does not have any upcoming appointments.  Report he does not have a PCP currently but he is working on finding one.  He will reach out to insurance to and his old job to get a list of physicians in the the network.  Plan: RN CM will send EMMI education regarding management of HTN and DM.  Will follow up with member within the next 2 weeks.  Unable to route note to PCP as he does not currently have one.  Will route once one is established.  Fall Risk  07/21/2019 02/14/2019 02/07/2019 01/10/2019 12/10/2018  Falls in the past year? 0 0 0 0 0  Number falls in past yr: 0 - - - -  Injury with Fall? 0 - - - -   Depression screen [facility] 2/9 07/21/2019 12/10/2018  Decreased Interest 0 0  Down, Depressed, Hopeless 0 0  PHQ - 2 Score 0 0   [facility] CM Care Plan Problem One     Most Recent Value  Care Plan Problem One  Knowledge deficit regarding management of diabetes as evidenced by elevated A1C  Role  Documenting the Problem One  Care Management Coordinator  Care Plan for Problem One  Active  [facility] Long Term Goal   Member's A1C will be decreased </= 10 within the next 3 months  [facility]N Long Term Goal Start Date  07/21/19  Interventions for Problem One Long Term Goal  Member educated on complications of uncontrolled diabetes and HTN.  Provided member with EMMI education on HTN and DM, Provided with BP monitor  [facility] CM Short Term Goal #1   Member will report scheduling appointment with new PCP within the next 2 weeks  THN CM Short Term Goal #1 Start Date  07/21/19  Interventions for Short Term Goal #1  Discussed importance of maintaining follow ups with PCP in effort to manage chronic medical conditions  THN CM Short Term Goal #2   Member will report decrease in cough and flu-like symptoms within the next 2 weeks  THN CM Short Term Goal #2 Start Date  07/21/19  Interventions for Short Term Goal #2  Reviewed Covid symptoms with member, encouraged to have test done     [name], RN, MSN [facility] Care Management  [facility] Care Manager [phone]

## 2019-07-22 ENCOUNTER — Encounter: Payer: Self-pay | Admitting: *Deleted

## 2019-08-04 ENCOUNTER — Other Ambulatory Visit: Payer: Self-pay | Admitting: *Deleted

## 2019-08-04 NOTE — Patient Outreach (Signed)
[facility]) Care Management  08/04/2019  CHEYNE BOULDEN Oct 25, 1965 [id number]   Call placed to follow up with member on establishing new PCP, no answer.  HIPAA compliant voice message left, will follow up within the next 3-4 business days.  [name], [place], MSN [facility] Care Management  [facility] Manager [phone]

## 2019-08-05 ENCOUNTER — Ambulatory Visit: Payer: BC Managed Care – PPO | Admitting: *Deleted

## 2019-08-10 ENCOUNTER — Other Ambulatory Visit: Payer: Self-pay | Admitting: *Deleted

## 2019-08-10 NOTE — Patient Outreach (Signed)
[facility]) Care Management  08/10/2019  Oscar Schultz March 02, 1966 [id number]   Outreach attempt #2, successful.  Call placed to member to follow up on new PCP.  He report he still has not secured a visit with a new physician.  State he did follow up with his insurance company and will have to go through the city to schedule appointment with one of their approved physicians.  Report he will have this done by the end of the month, will contact this care manager if he has any problems.  Will close case at this time due to not having a [facility] physician.  [name], [place], MSN [facility] Care Management  [facility] Manager [phone]

## 2019-08-18 NOTE — Progress Notes (Signed)
Scheduled to complete physical 08/24/19 with Dr. Mayford Knife.  AMD

## 2019-08-19 ENCOUNTER — Other Ambulatory Visit: Payer: Self-pay

## 2019-08-19 ENCOUNTER — Ambulatory Visit: Payer: Self-pay

## 2019-08-19 DIAGNOSIS — Z0000 Encounter for general adult medical examination without abnormal findings: Secondary | ICD-10-CM

## 2019-08-19 LAB — POCT URINALYSIS DIPSTICK
Bilirubin, UA: NEGATIVE
Blood, UA: NEGATIVE
Glucose, UA: POSITIVE — AB
Ketones, UA: NEGATIVE
Leukocytes, UA: NEGATIVE
Nitrite, UA: NEGATIVE
Protein, UA: NEGATIVE
Spec Grav, UA: 1.02 (ref 1.010–1.025)
Urobilinogen, UA: 0.2 E.U./dL
pH, UA: 5.5 (ref 5.0–8.0)

## 2019-08-23 LAB — CMP12+LP+TP+TSH+6AC+PSA+CBC…
ALT: 11 IU/L (ref 0–44)
AST: 15 IU/L (ref 0–40)
Albumin/Globulin Ratio: 1.4 (ref 1.2–2.2)
Albumin: 4.3 g/dL (ref 3.8–4.9)
Alkaline Phosphatase: 65 IU/L (ref 48–121)
BUN/Creatinine Ratio: 13 (ref 9–20)
BUN: 14 mg/dL (ref 6–24)
Basophils Absolute: 0 10*3/uL (ref 0.0–0.2)
Basos: 1 %
Bilirubin Total: 0.3 mg/dL (ref 0.0–1.2)
Calcium: 9.5 mg/dL (ref 8.7–10.2)
Chloride: 100 mmol/L (ref 96–106)
Chol/HDL Ratio: 6.6 ratio — ABNORMAL HIGH (ref 0.0–5.0)
Cholesterol, Total: 265 mg/dL — ABNORMAL HIGH (ref 100–199)
Creatinine, Ser: 1.04 mg/dL (ref 0.76–1.27)
EOS (ABSOLUTE): 0.1 10*3/uL (ref 0.0–0.4)
Eos: 2 %
Estimated CHD Risk: 1.4 times avg. — ABNORMAL HIGH (ref 0.0–1.0)
Free Thyroxine Index: 2.1 (ref 1.2–4.9)
GFR calc Af Amer: 94 mL/min/{1.73_m2} (ref >59)
GFR calc non Af Amer: 81 mL/min/{1.73_m2} (ref >59)
GGT: 18 IU/L (ref 0–65)
Globulin, Total: 3 g/dL (ref 1.5–4.5)
Glucose: 348 mg/dL — ABNORMAL HIGH (ref 65–99)
HDL: 40 mg/dL (ref >39)
Hematocrit: 43.5 % (ref 37.5–51.0)
Hemoglobin: 14.9 g/dL (ref 13.0–17.7)
Immature Grans (Abs): 0 10*3/uL (ref 0.0–0.1)
Immature Granulocytes: 0 %
Iron: 71 ug/dL (ref 38–169)
LDH: 188 IU/L (ref 121–224)
LDL Chol Calc (NIH): 203 mg/dL — ABNORMAL HIGH (ref 0–99)
Lymphocytes Absolute: 1.6 10*3/uL (ref 0.7–3.1)
Lymphs: 41 %
MCH: 29 pg (ref 26.6–33.0)
MCHC: 34.3 g/dL (ref 31.5–35.7)
MCV: 85 fL (ref 79–97)
Monocytes Absolute: 0.4 10*3/uL (ref 0.1–0.9)
Monocytes: 10 %
Neutrophils Absolute: 1.8 10*3/uL (ref 1.4–7.0)
Neutrophils: 46 %
Phosphorus: 3.1 mg/dL (ref 2.8–4.1)
Platelets: 226 10*3/uL (ref 150–450)
Potassium: 4.1 mmol/L (ref 3.5–5.2)
Prostate Specific Ag, Serum: 0.3 ng/mL (ref 0.0–4.0)
RBC: 5.14 x10E6/uL (ref 4.14–5.80)
RDW: 12.6 % (ref 11.6–15.4)
Sodium: 138 mmol/L (ref 134–144)
T3 Uptake Ratio: 29 % (ref 24–39)
T4, Total: 7.1 ug/dL (ref 4.5–12.0)
TSH: 1.85 u[IU]/mL (ref 0.450–4.500)
Total Protein: 7.3 g/dL (ref 6.0–8.5)
Triglycerides: 123 mg/dL (ref 0–149)
Uric Acid: 5.9 mg/dL (ref 3.8–8.4)
VLDL Cholesterol Cal: 22 mg/dL (ref 5–40)
WBC: 4 10*3/uL (ref 3.4–10.8)

## 2019-08-23 LAB — MICROALBUMIN / CREATININE URINE RATIO
Creatinine, Urine: 119.9 mg/dL
Microalb/Creat Ratio: 9 mg/g creat (ref 0–29)
Microalbumin, Urine: 10.3 ug/mL

## 2019-08-23 LAB — HGB A1C W/O EAG: Hgb A1c MFr Bld: 14.8 % — ABNORMAL HIGH (ref 4.8–5.6)

## 2019-08-24 ENCOUNTER — Ambulatory Visit: Payer: Self-pay | Admitting: Emergency Medicine

## 2019-08-24 ENCOUNTER — Other Ambulatory Visit: Payer: Self-pay

## 2019-08-24 ENCOUNTER — Encounter: Payer: Self-pay | Admitting: Emergency Medicine

## 2019-08-24 VITALS — BP 160/98 | HR 93 | Temp 98.8°F | Resp 14 | Ht 73.0 in | Wt 185.0 lb

## 2019-08-24 DIAGNOSIS — Z794 Long term (current) use of insulin: Secondary | ICD-10-CM

## 2019-08-24 DIAGNOSIS — Z0000 Encounter for general adult medical examination without abnormal findings: Secondary | ICD-10-CM

## 2019-08-24 NOTE — Progress Notes (Signed)
ER Provider Note       Time seen: 4:30 PM    I have reviewed the vital signs and the nursing notes.  HISTORY   Chief Complaint Employment Physical    HPI Oscar Schultz is a 54 y.o. male with a history of diabetes, hyperlipidemia, hypertension who presents today for annual physical examination. Patient denies any specific complaints. Denies fevers, chills, chest pain, shortness of breath, vomiting or diarrhea. Patient states he is not very compliant with his medications or insulin.  Past Medical History:  Diagnosis Date  . Aortic heart murmur   . Degenerative arthritis of left knee   . Diabetes mellitus ([place])    non compliant   . ED (erectile dysfunction)   . Elevated LDL cholesterol level   . Hypertension   . Low HDL (under 40)     No past surgical history on file.  Allergies Patient has no known allergies.   Review of Systems Constitutional: Negative for fever. Cardiovascular: Negative for chest pain. Respiratory: Negative for shortness of breath. Gastrointestinal: Negative for abdominal pain, vomiting and diarrhea. Musculoskeletal: Negative for back pain. Skin: Negative for rash. Neurological: Negative for headaches, focal weakness or numbness.  All systems negative/normal/unremarkable except as stated in the HPI  ____________________________________________   PHYSICAL EXAM:  VITAL SIGNS: Vitals:   08/24/19 1535  BP: (!) 160/98  Pulse: 93  Resp: 14  Temp: 98.8 F (37.1 C)  SpO2: 98%    Constitutional: Alert and oriented. Well appearing and in no distress. Eyes: Conjunctivae are normal. Normal extraocular movements. ENT      Head: Normocephalic and atraumatic.      Nose: No congestion/rhinnorhea.      Mouth/Throat: Mucous membranes are moist.      Neck: No stridor. Cardiovascular: Normal rate, regular rhythm. No murmurs, rubs, or gallops. Respiratory: Normal respiratory effort without tachypnea nor retractions. Breath sounds are clear and  equal bilaterally. No wheezes/rales/rhonchi. Gastrointestinal: Soft and nontender. Normal bowel sounds Musculoskeletal: Nontender with normal range of motion in extremities. No lower extremity tenderness nor edema. Neurologic:  Normal speech and language. No gross focal neurologic deficits are appreciated.  Skin:  Skin is warm, dry and intact. No rash noted. Psychiatric: Speech and behavior are normal.  ____________________________________________  EKG: Interpreted by me. Sinus rhythm with a rate of 84 bpm, LVH, repolarization abnormality, normal QT  ____________________________________________   LABS (pertinent positives/negatives)  Recent Results (from the past 2160 hour(s))  CMP12+LP+TP+TSH+6AC+PSA+CBC.     Status: Abnormal   Collection Time: 08/19/19  9:43 AM  Result Value Ref Range   Glucose 348 (H) 65 - 99 mg/dL   Uric Acid 5.9 3.8 - 8.4 mg/dL    Comment:            Therapeutic target for gout patients: <6.0   BUN 14 6 - 24 mg/dL   Creatinine, Ser 1.04 0.76 - 1.27 mg/dL   GFR calc non Af Amer 81 >59 mL/min/1.73   GFR calc Af Amer 94 >59 mL/min/1.73    Comment: **[facility] currently reports eGFR in compliance with the current**   recommendations of the [employer]. [facility] will   update reporting as new guidelines are published from the NKF-ASN   Task force.    BUN/Creatinine Ratio 13 9 - 20   Sodium 138 134 - 144 mmol/L   Potassium 4.1 3.5 - 5.2 mmol/L   Chloride 100 96 - 106 mmol/L   Calcium 9.5 8.7 - 10.2 mg/dL   Phosphorus 3.1  2.8 - 4.1 mg/dL   Total Protein 7.3 6.0 - 8.5 g/dL   Albumin 4.3 3.8 - 4.9 g/dL   Globulin, Total 3.0 1.5 - 4.5 g/dL   Albumin/Globulin Ratio 1.4 1.2 - 2.2   Bilirubin Total 0.3 0.0 - 1.2 mg/dL   Alkaline Phosphatase 65 48 - 121 IU/L    Comment:               **Please note reference interval change**   LDH 188 121 - 224 IU/L   AST 15 0 - 40 IU/L   ALT 11 0 - 44 IU/L   GGT 18 0 - 65 IU/L   Iron 71 38 - 169 ug/dL    Cholesterol, Total 265 (H) 100 - 199 mg/dL   Triglycerides 123 0 - 149 mg/dL   HDL 40 >39 mg/dL   VLDL Cholesterol Cal 22 5 - 40 mg/dL   LDL Chol Calc (NIH) 203 (H) 0 - 99 mg/dL   Chol/HDL Ratio 6.6 (H) 0.0 - 5.0 ratio    Comment:                                   T. Chol/HDL Ratio                                             Men  Women                               1/2 Avg.Risk  3.4    3.3                                   Avg.Risk  5.0    4.4                                2X Avg.Risk  9.6    7.1                                3X Avg.Risk 23.4   11.0    Estimated CHD Risk 1.4 (H) 0.0 - 1.0 times avg.    Comment: The CHD Risk is based on the T. Chol/HDL ratio. Other factors affect CHD Risk such as hypertension, smoking, diabetes, severe obesity, and family history of premature CHD.    TSH 1.850 0.450 - 4.500 uIU/mL   T4, Total 7.1 4.5 - 12.0 ug/dL   T3 Uptake Ratio 29 24 - 39 %   Free Thyroxine Index 2.1 1.2 - 4.9   Prostate Specific Ag, Serum 0.3 0.0 - 4.0 ng/mL    Comment: Roche ECLIA methodology. According to the American Urological Association, Serum PSA should decrease and remain at undetectable levels after radical prostatectomy. The AUA defines biochemical recurrence as an initial PSA value 0.2 ng/mL or greater followed by a subsequent confirmatory PSA value 0.2 ng/mL or greater. Values obtained with different assay methods or kits cannot be used interchangeably. Results cannot be interpreted as absolute evidence of the presence or absence of malignant disease.    WBC 4.0 3.4 - 10.8 x10E3/uL  RBC 5.14 4.14 - 5.80 x10E6/uL   Hemoglobin 14.9 13.0 - 17.7 g/dL   Hematocrit 43.5 37.5 - 51.0 %   MCV 85 79 - 97 fL   MCH 29.0 26.6 - 33.0 pg   MCHC 34.3 31.5 - 35.7 g/dL   RDW 12.6 11.6 - 15.4 %   Platelets 226 150 - 450 x10E3/uL   Neutrophils 46 Not Estab. %   Lymphs 41 Not Estab. %   Monocytes 10 Not Estab. %   Eos 2 Not Estab. %   Basos 1 Not Estab. %   Neutrophils  Absolute 1.8 1.4 - 7.0 x10E3/uL   Lymphocytes Absolute 1.6 0.7 - 3.1 x10E3/uL   Monocytes Absolute 0.4 0.1 - 0.9 x10E3/uL   EOS (ABSOLUTE) 0.1 0.0 - 0.4 x10E3/uL   Basophils Absolute 0.0 0.0 - 0.2 x10E3/uL   Immature Granulocytes 0 Not Estab. %   Immature Grans (Abs) 0.0 0.0 - 0.1 x10E3/uL  Hgb A1c w/o eAG     Status: Abnormal   Collection Time: 08/19/19  9:43 AM  Result Value Ref Range   Hgb A1c MFr Bld 14.8 (H) 4.8 - 5.6 %    Comment:          Prediabetes: 5.7 - 6.4          Diabetes: >6.4          Glycemic control for adults with diabetes: <7.0   Microalbumin / creatinine urine ratio     Status: None   Collection Time: 08/19/19  9:43 AM  Result Value Ref Range   Creatinine, Urine 119.9 Not Estab. mg/dL   Microalbumin, Urine 10.3 Not Estab. ug/mL   Microalb/Creat Ratio 9 0 - 29 mg/g creat    Comment:                        Normal:                0 -  29                        Moderately increased: 30 - 300                        Severely increased:       >300   POCT urinalysis dipstick     Status: Abnormal   Collection Time: 08/19/19 11:06 AM  Result Value Ref Range   Color, UA yellow    Clarity, UA clear    Glucose, UA Positive (A) Negative    Comment: 3+ 1036m/dL   Bilirubin, UA negative    Ketones, UA negative    Spec Grav, UA 1.020 1.010 - 1.025   Blood, UA negatve    pH, UA 5.5 5.0 - 8.0   Protein, UA Negative Negative   Urobilinogen, UA 0.2 0.2 or 1.0 E.U./dL   Nitrite, UA negative    Leukocytes, UA Negative Negative   Appearance     Odor     DIFFERENTIAL DIAGNOSIS  Medication noncompliance, diabetes, hypertension, hyperlipidemia, annual physical examination  ASSESSMENT AND PLAN  Annual physical examination   Plan: The patient had presented for annual examination. Patient's labs indicate noncompliance which she has admitted to. Patient states he has refills of his medications including antilipid medication, antihypertensives and diabetes medicines. He  states he will start back on medications. Unfortunately noncompliance is a major problem and motivating him to take his medicines as a major  problem. I will refer him back to endocrinology for close follow-up.  Lenise Arena MD    Note: This note was generated in part or whole with voice recognition software. Voice recognition is usually quite accurate but there are transcription errors that can and very often do occur. I apologize for any typographical errors that were not detected and corrected.

## 2019-09-15 ENCOUNTER — Other Ambulatory Visit: Payer: Self-pay

## 2019-09-15 DIAGNOSIS — Z794 Long term (current) use of insulin: Secondary | ICD-10-CM

## 2019-09-15 DIAGNOSIS — E7849 Other hyperlipidemia: Secondary | ICD-10-CM

## 2019-09-15 DIAGNOSIS — I10 Essential (primary) hypertension: Secondary | ICD-10-CM

## 2019-09-15 DIAGNOSIS — E1165 Type 2 diabetes mellitus with hyperglycemia: Secondary | ICD-10-CM

## 2019-09-15 MED ORDER — BASAGLAR KWIKPEN 100 UNIT/ML ~~LOC~~ SOPN: 15.0000 [IU] | PEN_INJECTOR | Freq: Every day | SUBCUTANEOUS | 0 refills | Status: DC

## 2019-09-15 MED ORDER — OZEMPIC (0.25 OR 0.5 MG/DOSE) 2 MG/1.5ML ~~LOC~~ SOPN: 0.5000 mg | PEN_INJECTOR | SUBCUTANEOUS | 2 refills | Status: DC

## 2019-09-15 MED ORDER — LANTUS SOLOSTAR 100 UNIT/ML ~~LOC~~ SOPN: 15.0000 [IU] | PEN_INJECTOR | Freq: Every day | SUBCUTANEOUS | 0 refills | Status: DC

## 2019-09-15 MED ORDER — FARXIGA 10 MG PO TABS: 10.0000 mg | ORAL_TABLET | Freq: Every day | ORAL | 0 refills | Status: DC

## 2019-09-15 MED ORDER — AMLODIPINE BESYLATE 10 MG PO TABS: 10.0000 mg | ORAL_TABLET | Freq: Every day | ORAL | 0 refills | Status: DC

## 2019-09-15 MED ORDER — ATORVASTATIN CALCIUM 40 MG PO TABS: 40.0000 mg | ORAL_TABLET | Freq: Every day | ORAL | 0 refills | Status: DC

## 2019-09-15 NOTE — Progress Notes (Signed)
[facility] contacted clinic to change Lantus insulin to [employer].  Rx request sent to Durward Parcel, PA.

## 2019-10-25 ENCOUNTER — Other Ambulatory Visit: Payer: Self-pay | Admitting: *Deleted

## 2019-10-25 NOTE — Patient Outreach (Signed)
[facility]) Care Management  10/25/2019  Oscar Schultz 1965-08-19 [id number]   Outreach attempt #1, unsuccessful.  Call placed to member to attempt engagement with [facility] and complete assessment of needs.  No answer, HIPAA compliant voice message left.  Will send unsuccessful outreach letter and follow up within the next 3-4 business days.  [name], [place], MSN [facility] Care Management  [facility] Manager [phone]

## 2019-10-28 ENCOUNTER — Other Ambulatory Visit: Payer: Self-pay | Admitting: *Deleted

## 2019-10-28 NOTE — Patient Outreach (Signed)
[facility]) Care Management  10/28/2019  Oscar Schultz 1966-03-01 [id number]   Outreach attempt #2, unsuccessful.  Call placed to member to attempt engagement with [facility] and complete assessment of needs.  No answer, HIPAA compliant voice message left.  Will follow up within the next 3-4 business days.  [name], [place], MSN [facility] Care Management  [facility] Manager [phone]

## 2019-11-02 ENCOUNTER — Other Ambulatory Visit: Payer: Self-pay | Admitting: *Deleted

## 2019-11-02 NOTE — Patient Outreach (Signed)
[facility]) Care Management  11/02/2019  Oscar Schultz 20-Jan-1966 [id number]   Outreach attempt #3, unsuccessful.  Call placed to member to attempt engagement with [facility] and complete assessment of needs. No answer, HIPAA compliant voice message left. Will make 4th and final attempt within the next 3 weeks.  If remain unsuccessful, will close case due to inability to establish contact.  [name], [place], MSN [facility] Care Management  [facility] Manager [phone]

## 2019-11-23 ENCOUNTER — Other Ambulatory Visit: Payer: Self-pay | Admitting: *Deleted

## 2019-11-23 NOTE — Patient Outreach (Signed)
[facility]) Care Management  11/23/2019  DAYSEAN TINKHAM 11/24/1965 [id number]    Outreach attempt #4, successful.  Identity verified.  This care manager introduced self and stated purpose of call.  [facility] care management services explained.    Social: Member report he is independent in all ADL's, still actively working outside of the home.  He is currently at work now, does not have much time to talk.  Conditions: Per chart, has history of HTN, DM (A1C - 14.8) and HLD.  Medications: Reviewed with member, expresses frustration regarding management of his diabetes.  State he is compliant with medications and diet, but feel his diabetes remain out of control despite being compliant.  For this reason, he state he gets discouraged.  Report he has changed his diet after seeing a nutritionist last year.  Appointments: Has the [facility] listed as his PCP, however he expresses concern regarding management of his health conditions.  State he will contact his insurance company (has a form of city insurance due to his previous job) regarding physicians within his network.  Next PCP visit scheduled for 9/7.  Report he does have a endocrinologist, can't recall his next visit, will call to inquire.  Denies any urgent concerns at this time, encouraged to contact this care manager with questions.  Plan: RN CM will follow up within the next 2 weeks.  Will send EMMI education regarding diabetes management.  Goals Addressed              This Visit's Progress   .  Manage diabetes better (pt-stated)        CARE PLAN ENTRY (see longtitudinal plan of care for additional care plan information)  Objective:  Lab Results  Component Value Date   HGBA1C 14.8 (H) 08/19/2019 .   Lab Results  Component Value Date   CREATININE 1.04 08/19/2019   CREATININE 0.97 09/20/2018   CREATININE 1.0 02/16/2018 .   Marland Kitchen No results found for: EGFR  Current Barriers:  Marland Kitchen Knowledge  Deficits related to basic Diabetes pathophysiology and self care/management . Knowledge Deficits related to medications used for management of diabetes  Case Manager Clinical Goal(s):  Over the next 60 days, patient will demonstrate improved adherence to prescribed treatment plan for diabetes self care/management as evidenced by: decreased A1C </=10 . Verbalize adherence to ADA/ carb modified diet within the next 28 days . Verbalize adherence to prescribed medication regimen within the next 28 days   Interventions:  . Provided education to patient about basic DM disease process . Reviewed medications with patient and discussed importance of medication adherence . Provided patient with written educational materials related to hypo and hyperglycemia and importance of correct treatment  Patient Self Care Activities:  . Self administers oral medications as prescribed . Self administers insulin as prescribed . Attends all scheduled provider appointments  Initial goal documentation       Valente David, RN, MSN [place] [phone]

## 2019-11-24 ENCOUNTER — Ambulatory Visit: Payer: BC Managed Care – PPO | Admitting: Physician Assistant

## 2019-11-29 ENCOUNTER — Other Ambulatory Visit: Payer: Self-pay

## 2019-11-29 ENCOUNTER — Ambulatory Visit: Payer: Self-pay | Admitting: Nurse Practitioner

## 2019-11-29 ENCOUNTER — Encounter: Payer: Self-pay | Admitting: Nurse Practitioner

## 2019-11-29 VITALS — BP 166/100 | HR 89 | Temp 98.4°F | Resp 12 | Ht 73.0 in | Wt 185.0 lb

## 2019-11-29 DIAGNOSIS — Z794 Long term (current) use of insulin: Secondary | ICD-10-CM

## 2019-11-29 LAB — POCT GLYCOSYLATED HEMOGLOBIN (HGB A1C): Hemoglobin A1C: 11.6 % — AB (ref 4.0–5.6)

## 2019-11-29 NOTE — Progress Notes (Signed)
Subjective:     Patient ID: Oscar Schultz, male   DOB: [birth year], 54 y.o.   MRN: [id number]  HPI Oscar Schultz is a 54 y.o. male with hx of DM, HTN and hyperlipidemia presents to the [facility] for f/u A1C. On his previous visit 5/28 it was 14.8. The employee had been off his medications for a few months because he did not feel they were helping. He reports going back on his medication and today his A1C is 11.6. He has not followed up with his endocrinologist. Today he reports that he is having some burning in his feet and wakes up at night and has to walk to help the pain. He denies any other problems at this time. He also states that he has not had his Covid vaccine but plans to get it when he leaves here today.  PMH: HTN,  IDDM, hyperlipidemia Surg:None SH: denies tobacco use, he does drink alcohol 2 x week, no drug use Allergies: NKDA Current Outpatient Medications on File Prior to Visit  Medication Sig Dispense Refill  . amLODipine (NORVASC) 10 MG tablet Take 1 tablet (10 mg total) by mouth daily. 90 tablet 0  . aspirin 81 MG chewable tablet Chew by mouth.    Marland Kitchen atorvastatin (LIPITOR) 40 MG tablet Take 1 tablet (40 mg total) by mouth daily. 90 tablet 0  . FARXIGA 10 MG TABS tablet Take 1 tablet (10 mg total) by mouth daily. 90 tablet 0  . Insulin Glargine (BASAGLAR KWIKPEN) 100 UNIT/ML Inject 0.15 mLs (15 Units total) into the skin at bedtime. 15 mL 0  . lisinopril (ZESTRIL) 40 MG tablet Take 1 tablet (40 mg total) by mouth daily. 90 tablet 3  . OZEMPIC, 0.25 OR 0.5 MG/DOSE, 2 MG/1.5ML SOPN Inject 0.375 mLs (0.5 mg total) as directed once a week. 2 pen 2  . RELION PEN NEEDLE 31G/8MM 31G X 8 MM MISC daily. use as directed    . Continuous Blood Gluc Receiver (FREESTYLE LIBRE 14 DAY READER) DEVI Use 1 Device as directed (Patient not taking: Reported on 11/29/2019)    . Continuous Blood Gluc Sensor (FREESTYLE LIBRE 14 DAY SENSOR) MISC Use 1 kit every 14 (fourteen) days for glucose monitoring  (Patient not taking: Reported on 11/29/2019)     No current facility-administered medications on file prior to visit.      Review of Systems  Constitutional: Negative for chills and fever.  HENT: Negative.   Eyes: Negative for visual disturbance.  Respiratory: Negative for shortness of breath.   Musculoskeletal:       Burning in feet  Skin: Negative for color change.  Neurological: Negative for light-headedness.       Objective:BP (!) 166/100 (BP Location: Left Arm, Patient Position: Sitting, Cuff Size: Large)   Pulse 89   Temp 98.4 F (36.9 C) (Oral)   Resp 12   Ht 6' 1"  (1.854 m)   Wt 185 lb (83.9 kg)   SpO2 98%   BMI 24.41 kg/m     Physical Exam Vitals and nursing note reviewed.  Constitutional:      Appearance: Normal appearance.  HENT:     Head: Normocephalic.     Nose: Nose normal.  Eyes:     Extraocular Movements: Extraocular movements intact.  Cardiovascular:     Rate and Rhythm: Normal rate and regular rhythm.  Pulmonary:     Effort: Pulmonary effort is normal.     Breath sounds: Normal breath sounds.  Abdominal:  Tenderness: There is no abdominal tenderness.  Musculoskeletal:        General: No swelling, tenderness, deformity or signs of injury. Normal range of motion.     Cervical back: Normal range of motion.     Right lower leg: No edema.     Left lower leg: No edema.  Skin:    General: Skin is warm and dry.  Neurological:     General: No focal deficit present.     Mental Status: He is alert.  Psychiatric:        Mood and Affect: Mood normal.        Behavior: Behavior normal.        Assessment:    54 y.o. male here for f/u A1C and c/o burning in feet.  Diff Dx diabetic neuropathy, HTN, elevated A1C.     Plan:    Discussed with the employee in detail complications associated with diabetes and HTN. Discussed neuropathy associated with diabetes. Referral for f/u with endocrinology. Discussed importance of taking medications as directed,  diet and exercise. Written instructions given in addition to verbal instructions. Employee given opportunity to ask questions. All questions answered and employee voices understanding. RTC in 6 months for follow up. Return sooner if problems.

## 2019-11-29 NOTE — Addendum Note (Signed)
Addended by: Gardner Candle on: 11/29/2019 04:54 PM   Modules accepted: Orders

## 2019-11-29 NOTE — Patient Instructions (Signed)
Diabetic Neuropathy Diabetic neuropathy refers to nerve damage that is caused by diabetes (diabetes mellitus). Over time, people with diabetes can develop nerve damage throughout the body. There are several types of diabetic neuropathy:  Peripheral neuropathy. This is the most common type of diabetic neuropathy. It causes damage to nerves that carry signals between the spinal cord and other parts of the body (peripheral nerves). This usually affects nerves in the feet and legs first, and may eventually affect the hands and arms. The damage affects the ability to sense touch or temperature.  Autonomic neuropathy. This type causes damage to nerves that control involuntary functions (autonomic nerves). These nerves carry signals that control: ? Heartbeat. ? Body temperature. ? Blood pressure. ? Urination. ? Digestion. ? Sweating. ? Sexual function. ? Response to changing blood sugar (glucose) levels.  Focal neuropathy. This type of nerve damage affects one area of the body, such as an arm, a leg, or the face. The injury may involve one nerve or a small group of nerves. Focal neuropathy can be painful and unpredictable, and occurs most often in older adults with diabetes. This often develops suddenly, but usually improves over time and does not cause long-term problems.  Proximal neuropathy. This type of nerve damage affects the nerves of the thighs, hips, buttocks, or legs. It causes severe pain, weakness, and muscle death (atrophy), usually in the thigh muscles. It is more common among older men and people who have type 2 diabetes. The length of recovery time may vary. What are the causes? Peripheral, autonomic, and focal neuropathies are caused by diabetes that is not well controlled with treatment. The cause of proximal neuropathy is not known, but it may be caused by inflammation related to uncontrolled blood glucose levels. What are the signs or symptoms? Peripheral neuropathy Peripheral  neuropathy develops slowly over time. When the nerves of the feet and legs no longer work, you may experience:  Burning, stabbing, or aching pain in the legs or feet.  Pain or cramping in the legs or feet.  Loss of feeling (numbness) and inability to feel pressure or pain in the feet. This can lead to: ? Thick calluses or sores on areas of constant pressure. ? Ulcers. ? Reduced ability to feel temperature changes.  Foot deformities.  Muscle weakness.  Loss of balance or coordination. Autonomic neuropathy The symptoms of autonomic neuropathy vary depending on which nerves are affected. Symptoms may include:  Problems with digestion, such as: ? Nausea or vomiting. ? Poor appetite. ? Bloating. ? Diarrhea or constipation. ? Trouble swallowing. ? Losing weight without trying to.  Problems with the heart, blood and lungs, such as: ? Dizziness, especially when standing up. ? Fainting. ? Shortness of breath. ? Irregular heartbeat.  Bladder problems, such as: ? Trouble starting or stopping urination. ? Leaking urine. ? Trouble emptying the bladder. ? Urinary tract infections (UTIs).  Problems with other body functions, such as: ? Sweat. You may sweat too much or too little. ? Temperature. You might get hot easily. Or, you might feel cold more than usual. ? Sexual function. Men may not be able to get or maintain an erection. Women may have vaginal dryness and difficulty with arousal. Focal neuropathy Symptoms affect only one area of the body. Common symptoms include:  Numbness.  Tingling.  Burning pain.  Prickling feeling.  Very sensitive skin.  Weakness.  Inability to move (paralysis).  Muscle twitching.  Muscles getting smaller (wasting).  Poor coordination.  Double or blurred vision. Proximal   neuropathy  Sudden, severe pain in the hip, thigh, or buttocks. Pain may spread from the back into the legs (sciatica).  Pain and numbness in the arms and  legs.  Tingling.  Loss of bladder control or bowel control.  Weakness and wasting of thigh muscles.  Difficulty getting up from a seated position.  Abdominal swelling.  Unexplained weight loss. How is this diagnosed? Diagnosis usually involves reviewing your medical history and any symptoms you have. Diagnosis varies depending on the type of neuropathy your health care provider suspects. Peripheral neuropathy Your health care provider will check areas that are affected by your nervous system (neurologic exam), such as your reflexes, how you move, and what you can feel. You may have other tests, such as:  Blood tests.  Removal and examination of fluid that surrounds the spinal cord (lumbar puncture).  CT scan.  MRI.  A test to check the nerves that control muscles (electromyogram, EMG).  Tests of how quickly messages pass through your nerves (nerve conduction velocity tests).  Removal of a small piece of nerve to be examined under a microscope (biopsy). Autonomic neuropathy You may have tests, such as:  Tests to measure your blood pressure and heart rate. This may include monitoring you while you are safely secured to an exam table that moves you from a lying position to an upright position (table tilt test).  Breathing tests to check your lungs.  Tests to check how food moves through the digestive system (gastric emptying tests).  Blood, sweat, or urine tests.  Ultrasound of your bladder.  Spinal fluid tests. Focal neuropathy This condition may be diagnosed with:  A neurologic exam.  CT scan.  MRI.  EMG.  Nerve conduction velocity tests. Proximal neuropathy There is no test to diagnose this type of neuropathy. You may have tests to rule out other possible causes of this type of neuropathy. Tests may include:  X-rays of your spine and lumbar region.  Lumbar puncture.  MRI. How is this treated? The goal of treatment is to keep nerve damage from getting  worse. The most important part of treatment is keeping your blood glucose level and your A1C level within your target range by following your diabetes management plan. Over time, maintaining lower blood glucose levels helps lessen symptoms. In some cases, you may need prescription pain medicine. Follow these instructions at home:  Lifestyle   Do not use any products that contain nicotine or tobacco, such as cigarettes and e-cigarettes. If you need help quitting, ask your health care provider.  Be physically active every day. Include strength training and balance exercises.  Follow a healthy meal plan.  Work with your health care provider to manage your blood pressure. General instructions  Follow your diabetes management plan as directed. ? Check your blood glucose levels as directed by your health care provider. ? Keep your blood glucose in your target range as directed by your health care provider. ? Have your A1C level checked at least two times a year, or as often as told by your health care provider.  Take over the counter and prescription medicines only as told by your health care provider. This includes insulin and diabetes medicine.  Do not drive or use heavy machinery while taking prescription pain medicines.  Check your skin and feet every day for cuts, bruises, redness, blisters, or sores.  Keep all follow up visits as told by your health care provider. This is important. Contact a health care provider if:  You have burning, stabbing, or aching pain in your legs or feet.  You are unable to feel pressure or pain in your feet.  You develop problems with digestion, such as: ? Nausea. ? Vomiting. ? Bloating. ? Constipation. ? Diarrhea. ? Abdominal pain.  You have difficulty with urination, such as inability: ? To control when you urinate (incontinence). ? To completely empty the bladder (retention).  You have palpitations.  You feel dizzy, weak, or faint when you  stand up. Get help right away if:  You cannot urinate.  You have sudden weakness or loss of coordination.  You have trouble speaking.  You have pain or pressure in your chest.  You have an irregular heart beat.  You have sudden inability to move a part of your body. Summary  Diabetic neuropathy refers to nerve damage that is caused by diabetes. It can affect nerves throughout the entire body, causing numbness and pain in the arms, legs, digestive tract, heart, and other body systems.  Keep your blood glucose level and your blood pressure in your target range, as directed by your health care provider. This can help prevent neuropathy from getting worse.  Check your skin and feet every day for cuts, bruises, redness, blisters, or sores.  Do not use any products that contain nicotine or tobacco, such as cigarettes and e-cigarettes. If you need help quitting, ask your health care provider. This information is not intended to replace advice given to you by your health care provider. Make sure you discuss any questions you have with your health care provider. Document Revised: 04/22/2017 Document Reviewed: 04/14/2016 Elsevier Patient Education  2020 Elsevier Inc.  Diabetes Mellitus and Nutrition, Adult When you have diabetes (diabetes mellitus), it is very important to have healthy eating habits because your blood sugar (glucose) levels are greatly affected by what you eat and drink. Eating healthy foods in the appropriate amounts, at about the same times every day, can help you:  Control your blood glucose.  Lower your risk of heart disease.  Improve your blood pressure.  Reach or maintain a healthy weight. Every person with diabetes is different, and each person has different needs for a meal plan. Your health care provider may recommend that you work with a diet and nutrition specialist (dietitian) to make a meal plan that is best for you. Your meal plan may vary depending on  factors such as:  The calories you need.  The medicines you take.  Your weight.  Your blood glucose, blood pressure, and cholesterol levels.  Your activity level.  Other health conditions you have, such as heart or kidney disease. How do carbohydrates affect me? Carbohydrates, also called carbs, affect your blood glucose level more than any other type of food. Eating carbs naturally raises the amount of glucose in your blood. Carb counting is a method for keeping track of how many carbs you eat. Counting carbs is important to keep your blood glucose at a healthy level, especially if you use insulin or take certain oral diabetes medicines. It is important to know how many carbs you can safely have in each meal. This is different for every person. Your dietitian can help you calculate how many carbs you should have at each meal and for each snack. Foods that contain carbs include:  Bread, cereal, rice, pasta, and crackers.  Potatoes and corn.  Peas, beans, and lentils.  Milk and yogurt.  Fruit and juice.  Desserts, such as cakes, cookies, ice cream, and candy.  How does alcohol affect me? Alcohol can cause a sudden decrease in blood glucose (hypoglycemia), especially if you use insulin or take certain oral diabetes medicines. Hypoglycemia can be a life-threatening condition. Symptoms of hypoglycemia (sleepiness, dizziness, and confusion) are similar to symptoms of having too much alcohol. If your health care provider says that alcohol is safe for you, follow these guidelines:  Limit alcohol intake to no more than 1 drink per day for nonpregnant women and 2 drinks per day for men. One drink equals 12 oz of beer, 5 oz of wine, or 1 oz of hard liquor.  Do not drink on an empty stomach.  Keep yourself hydrated with water, diet soda, or unsweetened iced tea.  Keep in mind that regular soda, juice, and other mixers may contain a lot of sugar and must be counted as carbs. What are tips  for following this plan?  Reading food labels  Start by checking the serving size on the "Nutrition Facts" label of packaged foods and drinks. The amount of calories, carbs, fats, and other nutrients listed on the label is based on one serving of the item. Many items contain more than one serving per package.  Check the total grams (g) of carbs in one serving. You can calculate the number of servings of carbs in one serving by dividing the total carbs by 15. For example, if a food has 30 g of total carbs, it would be equal to 2 servings of carbs.  Check the number of grams (g) of saturated and trans fats in one serving. Choose foods that have low or no amount of these fats.  Check the number of milligrams (mg) of salt (sodium) in one serving. Most people should limit total sodium intake to less than 2,300 mg per day.  Always check the nutrition information of foods labeled as "low-fat" or "nonfat". These foods may be higher in added sugar or refined carbs and should be avoided.  Talk to your dietitian to identify your daily goals for nutrients listed on the label. Shopping  Avoid buying canned, premade, or processed foods. These foods tend to be high in fat, sodium, and added sugar.  Shop around the outside edge of the grocery store. This includes fresh fruits and vegetables, bulk grains, fresh meats, and fresh dairy. Cooking  Use low-heat cooking methods, such as baking, instead of high-heat cooking methods like deep frying.  Cook using healthy oils, such as olive, canola, or sunflower oil.  Avoid cooking with butter, cream, or high-fat meats. Meal planning  Eat meals and snacks regularly, preferably at the same times every day. Avoid going long periods of time without eating.  Eat foods high in fiber, such as fresh fruits, vegetables, beans, and whole grains. Talk to your dietitian about how many servings of carbs you can eat at each meal.  Eat 4-6 ounces (oz) of lean protein each  day, such as lean meat, chicken, fish, eggs, or tofu. One oz of lean protein is equal to: ? 1 oz of meat, chicken, or fish. ? 1 egg. ?  cup of tofu.  Eat some foods each day that contain healthy fats, such as avocado, nuts, seeds, and fish. Lifestyle  Check your blood glucose regularly.  Exercise regularly as told by your health care provider. This may include: ? 150 minutes of moderate-intensity or vigorous-intensity exercise each week. This could be brisk walking, biking, or water aerobics. ? Stretching and doing strength exercises, such as yoga or weightlifting, at least  2 times a week.  Take medicines as told by your health care provider.  Do not use any products that contain nicotine or tobacco, such as cigarettes and e-cigarettes. If you need help quitting, ask your health care provider.  Work with a Veterinary surgeon or diabetes educator to identify strategies to manage stress and any emotional and social challenges. Questions to ask a health care provider  Do I need to meet with a diabetes educator?  Do I need to meet with a dietitian?  What number can I call if I have questions?  When are the best times to check my blood glucose? Where to find more information:  American Diabetes Association: diabetes.org  Academy of Nutrition and Dietetics: www.eatright.[employer] of Diabetes and Digestive and Kidney Diseases (NIH): CarFli[employer].tn Summary  A healthy meal plan will help you control your blood glucose and maintain a healthy lifestyle.  Working with a diet and nutrition specialist (dietitian) can help you make a meal plan that is best for you.  Keep in mind that carbohydrates (carbs) and alcohol have immediate effects on your blood glucose levels. It is important to count carbs and to use alcohol carefully. This information is not intended to replace advice given to you by your health care provider. Make sure you discuss any questions you have with your  health care provider. Document Revised: 02/20/2017 Document Reviewed: 04/14/2016 Elsevier Patient Education  2020 [employer].

## 2019-12-02 ENCOUNTER — Ambulatory Visit: Payer: BC Managed Care – PPO | Admitting: *Deleted

## 2019-12-06 ENCOUNTER — Other Ambulatory Visit: Payer: Self-pay | Admitting: *Deleted

## 2019-12-06 NOTE — Patient Outreach (Signed)
[facility]) Care Management  12/06/2019  Oscar Schultz 06-09-1965 [id number]   Call placed to member to follow up on management of diabetes and to complete initial assessment, no answer. HIPAA compliant voice message left.  Will follow up within the next 3-4 business days.  [name], [place], MSN [facility] Care Management  [facility] Manager [phone]

## 2019-12-09 ENCOUNTER — Other Ambulatory Visit: Payer: Self-pay | Admitting: *Deleted

## 2019-12-09 NOTE — Patient Outreach (Signed)
[facility]) Care Management  12/09/2019  ITZAEL LIPTAK 12/25/1965 [id number]   Outreach attempt #2, unsuccessful.    Call placed to member to follow up on management of diabetes and to complete initial assessment, no answer. HIPAA compliant voice message left.  Will send unsuccessful outreach letter and follow up within the next 3-4 business days.  [name], [place], MSN [facility] Care Management  [facility] Manager [phone]

## 2019-12-14 ENCOUNTER — Other Ambulatory Visit: Payer: Self-pay | Admitting: *Deleted

## 2019-12-14 NOTE — Patient Outreach (Signed)
[facility]) Care Management  12/14/2019  Oscar Schultz Dec 17, 1965 [id number]   Outreach attempt #3, unsuccessful.    Call placed to member to follow up on management of diabetes and to complete initial assessment, no answer. HIPAA compliant voice message left.  Will make 4th and final attempt within the next 4 weeks.  If remain unsuccessful, will close case due to inability to maintain contact.  [name], [place], MSN [facility] Care Management  [facility] Manager [phone]

## 2020-01-10 ENCOUNTER — Other Ambulatory Visit: Payer: Self-pay | Admitting: *Deleted

## 2020-01-10 NOTE — Patient Outreach (Addendum)
[facility]) Care Management  01/10/2020  CURRIE DENNIN 08-Jul-1965 [id number]   Outreach attempt #4, unsuccessful, HIPAA compliant voice message left.  No response from member after multiple unsuccessful outreach attempts and letter sent.  Will close case at this time due to inability to maintain contact.  Will notify member of case closure.  No PCP listed to notify.  [name], [place], MSN [facility] Care Management  [facility] Manager [phone]

## 2020-01-11 ENCOUNTER — Ambulatory Visit: Payer: Self-pay | Admitting: *Deleted

## 2020-02-08 ENCOUNTER — Other Ambulatory Visit: Payer: Self-pay

## 2020-02-08 DIAGNOSIS — E1165 Type 2 diabetes mellitus with hyperglycemia: Secondary | ICD-10-CM

## 2020-02-11 MED ORDER — BASAGLAR KWIKPEN 100 UNIT/ML ~~LOC~~ SOPN: 15.0000 [IU] | PEN_INJECTOR | Freq: Every day | SUBCUTANEOUS | 2 refills | Status: DC

## 2020-08-03 ENCOUNTER — Encounter: Payer: Self-pay | Admitting: Physician Assistant

## 2020-08-03 ENCOUNTER — Ambulatory Visit: Payer: Self-pay | Admitting: Physician Assistant

## 2020-08-03 VITALS — BP 184/105 | HR 83 | Temp 98.4°F

## 2020-08-03 DIAGNOSIS — J069 Acute upper respiratory infection, unspecified: Secondary | ICD-10-CM

## 2020-08-03 MED ORDER — FEXOFENADINE-PSEUDOEPHED ER 60-120 MG PO TB12: 1.0000 | ORAL_TABLET | Freq: Two times a day (BID) | ORAL | 0 refills | Status: DC

## 2020-08-03 MED ORDER — BENZONATATE 100 MG PO CAPS
200.0000 mg | ORAL_CAPSULE | Freq: Three times a day (TID) | ORAL | 0 refills | Status: DC | PRN
Start: 2020-08-03 — End: 2022-07-17

## 2020-08-03 NOTE — Progress Notes (Signed)
   Subjective: Upper respiratory infection    Patient ID: Oscar Schultz, male    DOB: 14-Aug-1965, 55 y.o.   MRN: [id number]  HPI  Patient presents with nasal and chest congestion for a few days.  Patient with change of weather also his complaint.  Patient said cough is productive.  Denies fever/chills.  Denies body aches.  Nausea vomiting diarrhea.  No recent travel or known contact with COVID-19.  Patient did take his blood pressure medication today when questioned about readings of 184/105.  Review of Systems    Diabetes, hyperlipidemia, hypertension. Objective:   Physical Exam No acute distress.  Temperature 98.4, pulse 83, BP is 184/105, patient 98% O2 sat on room air. HEENT is multiple bilateral edematous nasal turbinates with clear rhinorrhea.  Patient has postnasal drainage.  Neck is supple heart adenectomy or bruits.  Lungs are clear to auscultation.  Heart regular rate and rhythm.       Assessment & Plan: Viral respiratory infection  Patient given prescription for fexofenadine/ Sudafed and Tessalon Perles.  Advised to follow-up if no improvement in 3 days.

## 2020-08-03 NOTE — Progress Notes (Signed)
Presents for son-site rapid Covid-19 test to determine if he can come inside to the clinic to see provider.  Patient thinks he has a sinus infection.  [facility] Covid-19 Antigen Rapid Test Lot #:  [id number] Serial #:  [id number]  Exp:  09/04/2020  Rapid Covid-19 Ag Results = Negative  Hasn't been taking BP med since symptoms started.  S/Sx started the first of the month: Cough - productive - greensih mucus Pressure around eyes No discormfort to ear No pain to teeth/jaw No sore throat Sometimes hard to swallow Taking Tussin & Coricidin & [employer] Allergy pills  AMD

## 2020-08-07 DIAGNOSIS — E1165 Type 2 diabetes mellitus with hyperglycemia: Secondary | ICD-10-CM | POA: Insufficient documentation

## 2020-08-07 DIAGNOSIS — E1142 Type 2 diabetes mellitus with diabetic polyneuropathy: Secondary | ICD-10-CM | POA: Insufficient documentation

## 2020-08-14 ENCOUNTER — Ambulatory Visit: Payer: Self-pay

## 2020-08-14 ENCOUNTER — Other Ambulatory Visit: Payer: Self-pay

## 2020-08-14 DIAGNOSIS — Z0000 Encounter for general adult medical examination without abnormal findings: Secondary | ICD-10-CM

## 2020-08-14 LAB — POCT URINALYSIS DIPSTICK
Bilirubin, UA: NEGATIVE
Blood, UA: NEGATIVE
Glucose, UA: POSITIVE — AB
Ketones, UA: NEGATIVE
Leukocytes, UA: NEGATIVE
Nitrite, UA: NEGATIVE
Protein, UA: NEGATIVE
Spec Grav, UA: 1.015 (ref 1.010–1.025)
Urobilinogen, UA: 0.2 E.U./dL
pH, UA: 5.5 (ref 5.0–8.0)

## 2020-08-14 NOTE — Progress Notes (Signed)
Scheduled to complete physical 08/21/20 with Viviano Simas, FNP.  AMD

## 2020-08-15 LAB — CMP12+LP+TP+TSH+6AC+PSA+CBC…
ALT: 9 IU/L (ref 0–44)
AST: 13 IU/L (ref 0–40)
Albumin/Globulin Ratio: 1.3 (ref 1.2–2.2)
Albumin: 4.3 g/dL (ref 3.8–4.9)
Alkaline Phosphatase: 69 IU/L (ref 44–121)
BUN/Creatinine Ratio: 14 (ref 9–20)
BUN: 13 mg/dL (ref 6–24)
Basophils Absolute: 0.1 10*3/uL (ref 0.0–0.2)
Basos: 1 %
Bilirubin Total: 0.3 mg/dL (ref 0.0–1.2)
Calcium: 9.3 mg/dL (ref 8.7–10.2)
Chloride: 100 mmol/L (ref 96–106)
Chol/HDL Ratio: 6.7 ratio — ABNORMAL HIGH (ref 0.0–5.0)
Cholesterol, Total: 222 mg/dL — ABNORMAL HIGH (ref 100–199)
Creatinine, Ser: 0.93 mg/dL (ref 0.76–1.27)
EOS (ABSOLUTE): 0.1 10*3/uL (ref 0.0–0.4)
Eos: 2 %
Estimated CHD Risk: 1.4 times avg. — ABNORMAL HIGH (ref 0.0–1.0)
Free Thyroxine Index: 2.2 (ref 1.2–4.9)
GGT: 17 IU/L (ref 0–65)
Globulin, Total: 3.2 g/dL (ref 1.5–4.5)
Glucose: 324 mg/dL — ABNORMAL HIGH (ref 65–99)
HDL: 33 mg/dL — ABNORMAL LOW (ref >39)
Hematocrit: 44.7 % (ref 37.5–51.0)
Hemoglobin: 15.6 g/dL (ref 13.0–17.7)
Immature Grans (Abs): 0 10*3/uL (ref 0.0–0.1)
Immature Granulocytes: 1 %
Iron: 65 ug/dL (ref 38–169)
LDH: 175 IU/L (ref 121–224)
LDL Chol Calc (NIH): 156 mg/dL — ABNORMAL HIGH (ref 0–99)
Lymphocytes Absolute: 1.5 10*3/uL (ref 0.7–3.1)
Lymphs: 37 %
MCH: 29.4 pg (ref 26.6–33.0)
MCHC: 34.9 g/dL (ref 31.5–35.7)
MCV: 84 fL (ref 79–97)
Monocytes Absolute: 0.4 10*3/uL (ref 0.1–0.9)
Monocytes: 9 %
Neutrophils Absolute: 2.1 10*3/uL (ref 1.4–7.0)
Neutrophils: 50 %
Phosphorus: 3 mg/dL (ref 2.8–4.1)
Platelets: 260 10*3/uL (ref 150–450)
Potassium: 4.1 mmol/L (ref 3.5–5.2)
Prostate Specific Ag, Serum: 0.3 ng/mL (ref 0.0–4.0)
RBC: 5.3 x10E6/uL (ref 4.14–5.80)
RDW: 12.8 % (ref 11.6–15.4)
Sodium: 137 mmol/L (ref 134–144)
T3 Uptake Ratio: 30 % (ref 24–39)
T4, Total: 7.4 ug/dL (ref 4.5–12.0)
TSH: 1.77 u[IU]/mL (ref 0.450–4.500)
Total Protein: 7.5 g/dL (ref 6.0–8.5)
Triglycerides: 180 mg/dL — ABNORMAL HIGH (ref 0–149)
Uric Acid: 6.2 mg/dL (ref 3.8–8.4)
VLDL Cholesterol Cal: 33 mg/dL (ref 5–40)
WBC: 4.1 10*3/uL (ref 3.4–10.8)
eGFR: 97 mL/min/{1.73_m2} (ref >59)

## 2020-08-15 LAB — MICROALBUMIN / CREATININE URINE RATIO
Creatinine, Urine: 49.9 mg/dL
Microalb/Creat Ratio: <6 mg/g creat (ref 0–29)
Microalbumin, Urine: <3 ug/mL

## 2020-08-15 LAB — HGB A1C W/O EAG: Hgb A1c MFr Bld: 15.1 % — ABNORMAL HIGH (ref 4.8–5.6)

## 2020-08-21 ENCOUNTER — Other Ambulatory Visit: Payer: Self-pay | Admitting: Nurse Practitioner

## 2020-08-21 ENCOUNTER — Encounter: Payer: Self-pay | Admitting: Nurse Practitioner

## 2020-08-21 NOTE — Progress Notes (Signed)
Patient was a no show to annual physical 08/21/2020.  RN called patient with instructions to follow up with endocrinologist as most recent lab results evidence ongoing noncompliance with medication regimen.   Encouraged to reschedule physical to discuss lab results.

## 2020-09-19 ENCOUNTER — Encounter: Payer: Self-pay | Admitting: Adult Health

## 2020-09-19 ENCOUNTER — Ambulatory Visit: Payer: Self-pay | Admitting: Adult Health

## 2020-09-19 ENCOUNTER — Other Ambulatory Visit: Payer: Self-pay

## 2020-09-19 VITALS — BP 163/100 | HR 77 | Temp 98.2°F | Ht 73.0 in | Wt 184.0 lb

## 2020-09-19 DIAGNOSIS — Z794 Long term (current) use of insulin: Secondary | ICD-10-CM

## 2020-09-19 DIAGNOSIS — Z0000 Encounter for general adult medical examination without abnormal findings: Secondary | ICD-10-CM

## 2020-09-19 DIAGNOSIS — I10 Essential (primary) hypertension: Secondary | ICD-10-CM

## 2020-09-19 DIAGNOSIS — Z91199 Patient's noncompliance with other medical treatment and regimen due to unspecified reason: Secondary | ICD-10-CM

## 2020-09-19 DIAGNOSIS — E7849 Other hyperlipidemia: Secondary | ICD-10-CM

## 2020-09-19 NOTE — Progress Notes (Signed)
Pt presents today to complete physicals with Lin Glazier,NP

## 2020-09-19 NOTE — Progress Notes (Signed)
[facility] [address], [place] [address]  Internal MEDICINE  Office Visit Note  Patient Name: Oscar Schultz  [id number]  [id number]  Date of Service: 09/19/2020  Chief Complaint  Patient presents with   Annual Exam    Pt denies any issues or concerns at this time CL,RMA     HPI Pt is here for routine health maintenance examination.  He is a well appearing 55 yo AA male.  He is non-compliant based on history.  He reports seeing endocrinology in the past, as well as "another doctor for second opinion".  Overall his diet is poor, and his blood sugars are poorly controlled.  His last A1C shows  15.1.  He reports taking his medications like he should.  However he has not taken any today, because he wanted to see if it mattered.  3 weeks ago his Basaglar was increased to 25 units a day.  His A1C today is 11.6.  His other history is reviewed.   Current Medication: Outpatient Encounter Medications as of 09/19/2020  Medication Sig   amLODipine (NORVASC) 10 MG tablet Take 1 tablet (10 mg total) by mouth daily.   aspirin 81 MG chewable tablet Chew by mouth.   atorvastatin (LIPITOR) 40 MG tablet Take 1 tablet (40 mg total) by mouth daily.   benzonatate (TESSALON PERLES) 100 MG capsule Take 2 capsules (200 mg total) by mouth 3 (three) times daily as needed for cough.   Continuous Blood Gluc Receiver (FREESTYLE LIBRE 14 DAY READER) DEVI Use 1 Device as directed   Continuous Blood Gluc Sensor (FREESTYLE LIBRE 14 DAY SENSOR) MISC Use 1 kit every 14 (fourteen) days for glucose monitoring   FARXIGA 10 MG TABS tablet Take 1 tablet (10 mg total) by mouth daily.   fexofenadine-pseudoephedrine (ALLEGRA-D) 60-120 MG 12 hr tablet Take 1 tablet by mouth 2 (two) times daily.   gabapentin (NEURONTIN) 300 MG capsule Take 300 mg by mouth daily.   Insulin Glargine (BASAGLAR KWIKPEN) 100 UNIT/ML Inject 15 Units into the skin at bedtime.   lisinopril (ZESTRIL) 40 MG tablet Take 1 tablet (40  mg total) by mouth daily.   OZEMPIC, 0.25 OR 0.5 MG/DOSE, 2 MG/1.5ML SOPN Inject 0.375 mLs (0.5 mg total) as directed once a week.   RELION PEN NEEDLE 31G/8MM 31G X 8 MM MISC daily. use as directed   sildenafil (VIAGRA) 100 MG tablet Take 100 mg by mouth daily as needed.   hydrochlorothiazide (HYDRODIURIL) 25 MG tablet Take 25 mg by mouth daily.   No facility-administered encounter medications on file as of 09/19/2020.    Surgical History: History reviewed. No pertinent surgical history.  Medical History: Past Medical History:  Diagnosis Date   Aortic heart murmur    Degenerative arthritis of left knee    Diabetes mellitus (HCC)    non compliant    ED (erectile dysfunction)    Elevated LDL cholesterol level    Hypertension    Low HDL (under 40)     Family History: Family History  Problem Relation Age of Onset   Heart disease Mother    COPD Father    Aneurysm Father        brian   Coronary artery disease Brother    Heart attack Brother    Diabetes Sister     Social History: Social History   Socioeconomic History   Marital status: Married    Spouse name: Not on file   Number of children: Not on  file   Years of education: Not on file   Highest education level: Not on file  Occupational History   Not on file  Tobacco Use   Smoking status: Never   Smokeless tobacco: Never  Substance and Sexual Activity   Alcohol use: Yes    Alcohol/week: 2.0 standard drinks    Types: 2 Cans of beer per week    Comment: light beer   Drug use: Not on file   Sexual activity: Not on file  Other Topics Concern   Not on file  Social History Narrative   Not on file   Social Determinants of Health   Financial Resource Strain: Not on file  Food Insecurity: No Food Insecurity   Worried About Running Out of Food in the Last Year: Never true   Ran Out of Food in the Last Year: Never true  Transportation Needs: No Transportation Needs   Lack of Transportation (Medical): No   Lack  of Transportation (Non-Medical): No  Physical Activity: Not on file  Stress: Not on file  Social Connections: Not on file      Review of Systems  Constitutional:  Negative for activity change, appetite change and fatigue.  HENT:  Negative for congestion, sinus pain, trouble swallowing and voice change.   Eyes:  Negative for pain, discharge and visual disturbance.  Respiratory:  Negative for cough, chest tightness and shortness of breath.   Cardiovascular:  Negative for chest pain and leg swelling.  Gastrointestinal:  Negative for abdominal distention, abdominal pain, constipation and diarrhea.  Musculoskeletal:  Negative for arthralgias, back pain and neck pain.  Skin:  Negative for color change.  Neurological:  Negative for dizziness, weakness and headaches.  Hematological:  Negative for adenopathy.  Psychiatric/Behavioral:  Negative for agitation, confusion and suicidal ideas.     Vital Signs: BP (!) 163/100   Pulse 77   Temp 98.2 F (36.8 C)   Ht 6' 1"  (1.854 m)   Wt 184 lb (83.5 kg)   SpO2 99%   BMI 24.28 kg/m    Physical Exam Constitutional:      Appearance: He is normal weight.  HENT:     Head: Normocephalic.     Nose: Nose normal.     Mouth/Throat:     Mouth: Mucous membranes are dry.     Pharynx: Oropharynx is clear.  Eyes:     Extraocular Movements: Extraocular movements intact.     Pupils: Pupils are equal, round, and reactive to light.  Cardiovascular:     Rate and Rhythm: Normal rate and regular rhythm.  Pulmonary:     Effort: Pulmonary effort is normal.  Musculoskeletal:        General: Normal range of motion.     Cervical back: Normal range of motion.  Skin:    General: Skin is warm.  Neurological:     General: No focal deficit present.     Mental Status: He is alert and oriented to person, place, and time.  Psychiatric:        Mood and Affect: Mood normal.        Behavior: Behavior normal.        Thought Content: Thought content normal.         Judgment: Judgment normal.     LABS: Recent Results (from the past 2160 hour(s))  CMP12+LP+TP+TSH+6AC+PSA+CBC.     Status: Abnormal   Collection Time: 08/14/20 11:13 AM  Result Value Ref Range   Glucose 324 (H) 65 - 99  mg/dL   Uric Acid 6.2 3.8 - 8.4 mg/dL    Comment:            Therapeutic target for gout patients: <6.0   BUN 13 6 - 24 mg/dL   Creatinine, Ser 0.93 0.76 - 1.27 mg/dL   eGFR 97 >59 mL/min/1.73   BUN/Creatinine Ratio 14 9 - 20   Sodium 137 134 - 144 mmol/L   Potassium 4.1 3.5 - 5.2 mmol/L   Chloride 100 96 - 106 mmol/L   Calcium 9.3 8.7 - 10.2 mg/dL   Phosphorus 3.0 2.8 - 4.1 mg/dL   Total Protein 7.5 6.0 - 8.5 g/dL   Albumin 4.3 3.8 - 4.9 g/dL   Globulin, Total 3.2 1.5 - 4.5 g/dL   Albumin/Globulin Ratio 1.3 1.2 - 2.2   Bilirubin Total 0.3 0.0 - 1.2 mg/dL   Alkaline Phosphatase 69 44 - 121 IU/L   LDH 175 121 - 224 IU/L   AST 13 0 - 40 IU/L   ALT 9 0 - 44 IU/L   GGT 17 0 - 65 IU/L   Iron 65 38 - 169 ug/dL   Cholesterol, Total 222 (H) 100 - 199 mg/dL   Triglycerides 180 (H) 0 - 149 mg/dL   HDL 33 (L) >39 mg/dL   VLDL Cholesterol Cal 33 5 - 40 mg/dL   LDL Chol Calc (NIH) 156 (H) 0 - 99 mg/dL   Chol/HDL Ratio 6.7 (H) 0.0 - 5.0 ratio    Comment:                                   T. Chol/HDL Ratio                                             Men  Women                               1/2 Avg.Risk  3.4    3.3                                   Avg.Risk  5.0    4.4                                2X Avg.Risk  9.6    7.1                                3X Avg.Risk 23.4   11.0    Estimated CHD Risk 1.4 (H) 0.0 - 1.0 times avg.    Comment: The CHD Risk is based on the T. Chol/HDL ratio. Other factors affect CHD Risk such as hypertension, smoking, diabetes, severe obesity, and family history of premature CHD.    TSH 1.770 0.450 - 4.500 uIU/mL   T4, Total 7.4 4.5 - 12.0 ug/dL   T3 Uptake Ratio 30 24 - 39 %   Free Thyroxine Index 2.2 1.2 - 4.9   Prostate Specific  Ag, Serum 0.3 0.0 - 4.0 ng/mL    Comment: Roche ECLIA methodology. According to the American  Urological Association, Serum PSA should decrease and remain at undetectable levels after radical prostatectomy. The AUA defines biochemical recurrence as an initial PSA value 0.2 ng/mL or greater followed by a subsequent confirmatory PSA value 0.2 ng/mL or greater. Values obtained with different assay methods or kits cannot be used interchangeably. Results cannot be interpreted as absolute evidence of the presence or absence of malignant disease.    WBC 4.1 3.4 - 10.8 x10E3/uL   RBC 5.30 4.14 - 5.80 x10E6/uL   Hemoglobin 15.6 13.0 - 17.7 g/dL   Hematocrit 44.7 37.5 - 51.0 %   MCV 84 79 - 97 fL   MCH 29.4 26.6 - 33.0 pg   MCHC 34.9 31.5 - 35.7 g/dL   RDW 12.8 11.6 - 15.4 %   Platelets 260 150 - 450 x10E3/uL   Neutrophils 50 Not Estab. %   Lymphs 37 Not Estab. %   Monocytes 9 Not Estab. %   Eos 2 Not Estab. %   Basos 1 Not Estab. %   Neutrophils Absolute 2.1 1.4 - 7.0 x10E3/uL   Lymphocytes Absolute 1.5 0.7 - 3.1 x10E3/uL   Monocytes Absolute 0.4 0.1 - 0.9 x10E3/uL   EOS (ABSOLUTE) 0.1 0.0 - 0.4 x10E3/uL   Basophils Absolute 0.1 0.0 - 0.2 x10E3/uL   Immature Granulocytes 1 Not Estab. %   Immature Grans (Abs) 0.0 0.0 - 0.1 x10E3/uL  Hgb A1c w/o eAG     Status: Abnormal   Collection Time: 08/14/20 11:13 AM  Result Value Ref Range   Hgb A1c MFr Bld 15.1 (H) 4.8 - 5.6 %    Comment:          Prediabetes: 5.7 - 6.4          Diabetes: >6.4          Glycemic control for adults with diabetes: <7.0   Microalbumin / creatinine urine ratio     Status: None   Collection Time: 08/14/20 11:14 AM  Result Value Ref Range   Creatinine, Urine 49.9 Not Estab. mg/dL   Microalbumin, Urine <3.0 Not Estab. ug/mL   Microalb/Creat Ratio <6 0 - 29 mg/g creat    Comment:                        Normal:                0 -  29                        Moderately increased: 30 - 300                         Severely increased:       >300   POCT urinalysis dipstick     Status: Abnormal   Collection Time: 08/14/20 11:50 AM  Result Value Ref Range   Color, UA Yellow    Clarity, UA Clear    Glucose, UA Positive (A) Negative    Comment: 3+ Patient on Farxiga   Bilirubin, UA Negative    Ketones, UA Negative    Spec Grav, UA 1.015 1.010 - 1.025   Blood, UA Negative    pH, UA 5.5 5.0 - 8.0   Protein, UA Negative Negative   Urobilinogen, UA 0.2 0.2 or 1.0 E.U./dL   Nitrite, UA Negative    Leukocytes, UA Negative Negative   Appearance     Odor  Assessment/Plan: 1. Annual physical exam Discussed importance of Diabetic Eye exam.  As well as vaccines like PNA COVID and Shingles.   2. Noncompliance Pt is historically non-compliant with medications.   3. Type 2 diabetes mellitus with hyperglycemia, with long-term current use of insulin (HCC) Increase Basaglar to 30 units daily.  I have explained to patient he needs one provider to manage his medications, it needs to be the [facility] provider, or his other one.  He will let us know which.   Discussed with patient goal A1c of less than 7, pre-prandial blood sugars 80-120, and random blood sugar less than 180. Encouraged patient to continue to keep a log of blood sugars, and to notify for blood sugars less than 60 or greater than 400.  Also discussed complications of poorly controlled Diabetes.  affects every system in your body, ie.   Brain - dementia/Alzheimers, stroke / paralysis eyes - glaucoma/blindness,  heart - heart attack/heart failure,  kidneys - dialysis,  stomach - gastric paralysis,  intestines - malabsorption,  nerves - severe painful neuritis,  Sex - Erectile Dysfunction and Impotence circulation - gangrene & loss of a leg(s)  and finally Cancer.   4. Essential hypertension Hypertension Counseling:   The following hypertensive lifestyle modification were recommended and discussed:  1. Limiting alcohol intake to less  than 1 oz/day of ethanol:(24 oz of beer or 8 oz of wine or 2 oz of 100-proof whiskey). 2. Take baby ASA 81 mg daily. 3. Importance of regular aerobic exercise and losing weight. 4. Reduce dietary saturated fat and cholesterol intake for overall cardiovascular health. 5. Maintaining adequate dietary potassium, calcium, and magnesium intake. 6. Regular monitoring of the blood pressure. 7. Reduce sodium intake to less than 100 mmol/day (less than 2.3 gm of sodium or less than 6 gm of sodium choride)    5. Other hyperlipidemia Discussed importance of regularly taking Lipitor as well as lifestyle interventions.      General Counseling: clevland cork understanding of the findings of todays visit and agrees with plan of treatment. I have discussed any further diagnostic evaluation that may be needed or ordered today. We also reviewed his medications today. he has been encouraged to call the office with any questions or concerns that should arise related to todays visit.    No orders of the defined types were placed in this encounter.   No orders of the defined types were placed in this encounter.   Total time spent:40 Minutes  Time spent includes review of chart, medications, test results, and follow up plan with the patient.    Kendell Bane AGNP-C Nurse Practitioner

## 2020-10-16 ENCOUNTER — Other Ambulatory Visit: Payer: Self-pay | Admitting: Physician Assistant

## 2020-10-16 DIAGNOSIS — Z794 Long term (current) use of insulin: Secondary | ICD-10-CM

## 2020-10-16 DIAGNOSIS — E1165 Type 2 diabetes mellitus with hyperglycemia: Secondary | ICD-10-CM

## 2021-01-02 ENCOUNTER — Other Ambulatory Visit: Payer: Self-pay | Admitting: Physician Assistant

## 2021-01-02 ENCOUNTER — Other Ambulatory Visit: Payer: Self-pay | Admitting: Registered Nurse

## 2021-01-02 DIAGNOSIS — E1165 Type 2 diabetes mellitus with hyperglycemia: Secondary | ICD-10-CM

## 2021-01-02 DIAGNOSIS — I10 Essential (primary) hypertension: Secondary | ICD-10-CM

## 2021-01-02 DIAGNOSIS — Z794 Long term (current) use of insulin: Secondary | ICD-10-CM

## 2021-01-02 MED ORDER — DAPAGLIFLOZIN PROPANEDIOL 10 MG PO TABS: 10.0000 mg | ORAL_TABLET | Freq: Every day | ORAL | 0 refills | Status: DC

## 2021-01-02 NOTE — Progress Notes (Signed)
   Subjective: Medication refill    Patient ID: Oscar Schultz, male    DOB: [birth year], 55 y.o.   MRN: [id number]  HPI Request medication refill f Farxiga at 10 mg.  Patient last labs for greater than 6 months ago hemoglobin A1c of 15.1 there is a history of noncompliance with both medication and appointments.  Review of Systems Diabetes, hyperlipidemia, hypertension.    Objective:   Physical Exam This is a virtual visit      Assessment & Plan: Diabetes   Advised patient refill medication for 1 month.  Patient must return to clinic for fasting labs before further determination of continued care.

## 2021-01-11 ENCOUNTER — Other Ambulatory Visit: Payer: Self-pay

## 2021-01-11 DIAGNOSIS — E1165 Type 2 diabetes mellitus with hyperglycemia: Secondary | ICD-10-CM

## 2021-01-11 DIAGNOSIS — Z794 Long term (current) use of insulin: Secondary | ICD-10-CM

## 2021-01-11 NOTE — Progress Notes (Signed)
Pt present today for labs a1c, bun and creat follow up. Apt to follow up with results needs to be scheduled.Oscar Schultz

## 2021-01-12 LAB — HGB A1C W/O EAG: Hgb A1c MFr Bld: 10.8 % — ABNORMAL HIGH (ref 4.8–5.6)

## 2021-01-12 LAB — BUN+CREAT
BUN/Creatinine Ratio: 16 (ref 9–20)
BUN: 15 mg/dL (ref 6–24)
Creatinine, Ser: 0.92 mg/dL (ref 0.76–1.27)
eGFR: 98 mL/min/{1.73_m2} (ref >59)

## 2021-01-16 ENCOUNTER — Other Ambulatory Visit: Payer: Self-pay

## 2021-01-17 ENCOUNTER — Encounter: Payer: Self-pay | Admitting: Physician Assistant

## 2021-01-17 ENCOUNTER — Other Ambulatory Visit: Payer: Self-pay

## 2021-01-17 ENCOUNTER — Ambulatory Visit: Payer: Self-pay | Admitting: Physician Assistant

## 2021-01-17 VITALS — BP 160/90 | HR 78 | Temp 98.0°F | Resp 14 | Ht 73.0 in | Wt 189.0 lb

## 2021-01-17 DIAGNOSIS — E1165 Type 2 diabetes mellitus with hyperglycemia: Secondary | ICD-10-CM

## 2021-01-17 DIAGNOSIS — Z794 Long term (current) use of insulin: Secondary | ICD-10-CM

## 2021-01-17 DIAGNOSIS — E7849 Other hyperlipidemia: Secondary | ICD-10-CM

## 2021-01-17 MED ORDER — LISINOPRIL 20 MG PO TABS: 20.0000 mg | ORAL_TABLET | Freq: Every day | ORAL | 3 refills | Status: DC

## 2021-01-17 MED ORDER — BASAGLAR KWIKPEN 100 UNIT/ML ~~LOC~~ SOPN: 15.0000 [IU] | PEN_INJECTOR | Freq: Every day | SUBCUTANEOUS | 2 refills | Status: DC

## 2021-01-17 MED ORDER — DAPAGLIFLOZIN PROPANEDIOL 10 MG PO TABS: 10.0000 mg | ORAL_TABLET | Freq: Every day | ORAL | 3 refills | Status: AC

## 2021-01-17 MED ORDER — ATORVASTATIN CALCIUM 40 MG PO TABS: 40.0000 mg | ORAL_TABLET | Freq: Every day | ORAL | 0 refills | Status: AC

## 2021-01-17 NOTE — Progress Notes (Signed)
BP = 187/109 - Automatic BP = 160/90 - Manual Recheck  Said he was taken off of Lisinopril  States he's out of Ozempic - ran out [employer] it makes him lose too much weight  Needs Rx refill for [place]  AMD

## 2021-01-17 NOTE — Progress Notes (Signed)
   Subjective: Diabetes    Patient ID: Oscar Schultz, male    DOB: 11-10-1965, 55 y.o.   MRN: [id number]  HPI Patient presents for evaluation of diabetes.  Patient admits to noncompliance of medication.  Patient admits to poor dietary habits.  Patient also stopped taking his lisinopril because he was worried about the side effects of angioedema.  Patient never experienced angioedema with the medication.  Patient also stopped taking Ozempic when the medication ran out because he was worried about weight loss.  Patient states frustration with seeing a different care provider at which time he came to this clinic.  States is clear there is a long-term provider. Review of Systems Diabetes, hyperlipidemia, hypertension.    Objective:   Physical Exam No acute distress.  Temperature 98, pulse 78, respiration 14, BP is 160/90, patient 90% O2 sat on room air.  Patient weighs 189 pounds BMI is 2 4.9. HEENT is unremarkable.  Neck is supple without lymphadenopathy or bruits.  Lungs clear to auscultation.  Heart regular rate and rhythm.   Abdomen is negative HSM, normoactive bowel sounds, soft, nontender to palpation.  Patient hemoglobin A1c has decreased from 15.1 to 10.8.       Assessment & Plan: Uncontrolled diabetes.   Patient is amenable to restarting medications as directed.  A consult to endocrinology will be generated.  Patient return back in 2 weeks for 3-day blood pressure check.

## 2021-05-12 ENCOUNTER — Other Ambulatory Visit: Payer: Self-pay

## 2021-05-12 ENCOUNTER — Emergency Department
Admission: EM | Admit: 2021-05-12 | Discharge: 2021-05-12 | Disposition: A | Discharge status: Home / Self Care | Payer: BC Managed Care – PPO | Attending: Emergency Medicine | Admitting: Emergency Medicine

## 2021-05-12 ENCOUNTER — Encounter: Payer: Self-pay | Admitting: Emergency Medicine

## 2021-05-12 DIAGNOSIS — E119 Type 2 diabetes mellitus without complications: Secondary | ICD-10-CM | POA: Diagnosis not present

## 2021-05-12 DIAGNOSIS — H5712 Ocular pain, left eye: Secondary | ICD-10-CM | POA: Insufficient documentation

## 2021-05-12 DIAGNOSIS — I10 Essential (primary) hypertension: Secondary | ICD-10-CM | POA: Diagnosis not present

## 2021-05-12 MED ORDER — ERYTHROMYCIN 5 MG/GM OP OINT: 1.0000 "application " | TOPICAL_OINTMENT | Freq: Four times a day (QID) | OPHTHALMIC | 0 refills | Status: AC

## 2021-05-12 MED ORDER — FLUORESCEIN SODIUM 1 MG OP STRP
1.0000 | ORAL_STRIP | Freq: Once | OPHTHALMIC | Status: AC
  Administered 2021-05-12: 1 via OPHTHALMIC
  Filled 2021-05-12: qty 1

## 2021-05-12 NOTE — ED Triage Notes (Signed)
Pt via POV from home. Pt c/o L eye redness, swelling, and pain since this AM. Pt states it feels like something is in there. Pt is A&Ox4 and NAD

## 2021-05-12 NOTE — Discharge Instructions (Signed)
Please start using the antibiotic ointment in your eye 4 times a day.  Please call ophthalmology tomorrow to schedule an appointment.

## 2021-05-12 NOTE — ED Provider Notes (Signed)
[facility] Provider Note    Event Date/Time   First MD Initiated Contact with Patient 05/12/21 1329     (approximate)   History   Eye Pain   HPI  Oscar Schultz is a 56 y.o. male past medical history of diabetes who presents with left eye pain.  Patient woke up with foreign body sensation and pain in the left eye.  Light bothers it.  Denies headache nausea vomiting.  Does feel like his vision is blurred.  Does not wear contacts but does wear glasses.  No prior eye surgeries.  Denies change in color.    Past Medical History:  Diagnosis Date   Aortic heart murmur    Degenerative arthritis of left knee    Diabetes mellitus (HCC)    non compliant    ED (erectile dysfunction)    Elevated LDL cholesterol level    Hypertension    Low HDL (under 40)     Patient Active Problem List   Diagnosis Date Noted   Controlled type 2 diabetes mellitus with hyperglycemia, with long-term current use of insulin (HCC) 11/07/2018   Essential hypertension 11/07/2018   Mixed hyperlipidemia with apolipoprotein E2 variant 11/07/2018     Physical Exam  Triage Vital Signs: ED Triage Vitals  Enc Vitals Group     BP 05/12/21 1242 (!) 178/96     Pulse Rate 05/12/21 1242 87     Resp 05/12/21 1242 16     Temp 05/12/21 1242 98.1 F (36.7 C)     Temp Source 05/12/21 1242 Oral     SpO2 05/12/21 1242 96 %     Weight 05/12/21 1241 200 lb (90.7 kg)     Height 05/12/21 1241 6\' 1"  (1.854 m)     Head Circumference --      Peak Flow --      Pain Score 05/12/21 1240 10     Pain Loc --      Pain Edu? --      Excl. in GC? --     Most recent vital signs: Vitals:   05/12/21 1242  BP: (!) 178/96  Pulse: 87  Resp: 16  Temp: 98.1 F (36.7 C)  SpO2: 96%     General: Awake, no distress.  CV:  Good peripheral perfusion.  Resp:  Normal effort.  Abd:  No distention.  Neuro:             Awake, Alert, Oriented x 3  Other:  Left eyelid with mild conjunctival injection,  scant green discharge at the left medial canthus  PERRLA  EOMI  No proptosis or chemosis  No corneal abrasion on fluorescein exam  IOP 10 and 14  VA  OS/OD 20/25 corrected   ED Results / Procedures / Treatments  Labs (all labs ordered are listed, but only abnormal results are displayed) Labs Reviewed - No data to display   EKG     RADIOLOGY    PROCEDURES:  Critical Care performed: No   MEDICATIONS ORDERED IN ED: Medications  fluorescein ophthalmic strip 1 strip (1 strip Both Eyes Given 05/12/21 1343)     IMPRESSION / MDM / ASSESSMENT AND PLAN / ED COURSE  I reviewed the triage vital signs and the nursing notes.                              Differential diagnosis includes, but is not limited to, corneal abrasion,  conjunctivitis, iritis, acute angle-closure glaucoma, optic neuritis  Is a 55 year old male presents with acute eye pain and foreign body sensation that occurred upon wakening today.  He has mild conjunctival injection in the left eye but otherwise his exam is rather unremarkable.  Visual acuity is 20/25 bilateral with his glasses on, extraocular movements intact pupils equal round and reactive to light and accommodation there is no corneal abrasion or foreign body on lid eversion, ocular pressure is normal ruling out acute angle-closure glaucoma.  After tetracaine drop patient's pain significantly improved which to me suggest that this is a superficial process.  He did have some mild discharge in the medial canthus of the left eye so this could be a bacterial conjunctivitis.  Will treat with erythromycin ointment patient does not wear contacts.  Advised that he call ophthalmology tomorrow to be seen.      FINAL CLINICAL IMPRESSION(S) / ED DIAGNOSES   Final diagnoses:  Pain of left eye     Rx / DC Orders   ED Discharge Orders          Ordered    erythromycin ophthalmic ointment  4 times daily        05/12/21 1403             Note:  This  document was prepared using Dragon voice recognition software and may include unintentional dictation errors.   Georga Hacking, MD 05/12/21 [phone]

## 2021-05-23 ENCOUNTER — Other Ambulatory Visit: Payer: Self-pay | Admitting: Physician Assistant

## 2021-05-23 DIAGNOSIS — E7849 Other hyperlipidemia: Secondary | ICD-10-CM

## 2021-05-24 ENCOUNTER — Other Ambulatory Visit: Payer: Self-pay

## 2021-08-15 ENCOUNTER — Encounter: Payer: BC Managed Care – PPO | Admitting: Physician Assistant

## 2021-09-06 NOTE — Progress Notes (Deleted)
Pt presents today for physical labs, will return to clinic for scheduled physical.  

## 2021-09-12 ENCOUNTER — Encounter: Payer: BC Managed Care – PPO | Admitting: Physician Assistant

## 2022-04-14 ENCOUNTER — Ambulatory Visit: Payer: Self-pay | Admitting: Physician Assistant

## 2022-04-14 ENCOUNTER — Encounter: Payer: Self-pay | Admitting: Physician Assistant

## 2022-04-14 VITALS — BP 134/88 | HR 74 | Temp 97.7°F | Resp 12 | Ht 73.0 in | Wt 192.0 lb

## 2022-04-14 DIAGNOSIS — E1165 Type 2 diabetes mellitus with hyperglycemia: Secondary | ICD-10-CM

## 2022-04-14 MED ORDER — DEXCOM G7 RECEIVER DEVI: 1.0000 | 0 refills | Status: DC

## 2022-04-14 MED ORDER — DEXCOM G7 SENSOR MISC: 1.0000 | 6 refills | Status: AC

## 2022-04-14 NOTE — Progress Notes (Signed)
   Subjective: Vertigo    Patient ID: Oscar Schultz, male    DOB: 1965/11/24, 57 y.o.   MRN: [id number]  HPI Patient is follow-up from ER visit last night secondary to vertigo.  Patient states he had increasing weakness and dizziness and went to the emergency room.  Patient had a complete cardiac workup and CT scan of the head without acute findings..  Lab results not available for review.  Patient was discharged stating no cause found for his complaint.  Patient had a slight abnormal EKG was given to follow-up with cardiology.  Patient is not follow-up with this clinic for over a year and a half.  Patient has a history of noncompliance of medication and referrals.  He is requesting to reestablish care with this clinic.   Review of Systems Diabetes type 2 uncontrolled, hyperlipidemia, and hypertension.    Objective:   Physical Exam BP is is 143/86, pulse 69, respiration 12, temperature 97.7, patient 1% O2 sat on room air.  Patient was found to be tilt negative for orthostatic readings.  Patient with 192 pounds and BMI is 25.33. HEENT was unremarkable. Neck was supple for lymphadenopathy or bruits. Lungs clear to auscultation. Heart regular rate and rhythm. Reviewed discharge instructions from E.R.    Assessment & Plan: Resolved vertigo   Patient will be scheduled for complete physical exam to include fasting labs.

## 2022-04-14 NOTE — Progress Notes (Signed)
Pt presents today for follow up from ED last night for light headedness along with abd EKG per [facility]

## 2022-04-23 DIAGNOSIS — R9431 Abnormal electrocardiogram [ECG] [EKG]: Secondary | ICD-10-CM | POA: Insufficient documentation

## 2022-04-23 DIAGNOSIS — E119 Type 2 diabetes mellitus without complications: Secondary | ICD-10-CM | POA: Insufficient documentation

## 2022-05-24 ENCOUNTER — Other Ambulatory Visit: Payer: Self-pay | Admitting: Physician Assistant

## 2022-05-24 DIAGNOSIS — E1165 Type 2 diabetes mellitus with hyperglycemia: Secondary | ICD-10-CM

## 2022-06-02 ENCOUNTER — Telehealth: Payer: Self-pay

## 2022-06-02 NOTE — Telephone Encounter (Signed)
Duplicate request.  Rec'd hard copy fax from [employer].  Electronic request already in Epic - routed it to [employer], [employer].  AMD

## 2022-07-11 ENCOUNTER — Ambulatory Visit: Payer: Self-pay

## 2022-07-11 DIAGNOSIS — Z0000 Encounter for general adult medical examination without abnormal findings: Secondary | ICD-10-CM

## 2022-07-11 NOTE — Progress Notes (Signed)
Pt completed lab portion of sceduled physical. UA will be completed at physical.

## 2022-07-12 LAB — CMP12+LP+TP+TSH+6AC+PSA+CBC…
ALT: 14 IU/L (ref 0–44)
AST: 13 IU/L (ref 0–40)
Albumin/Globulin Ratio: 1.5 (ref 1.2–2.2)
Albumin: 4.2 g/dL (ref 3.8–4.9)
Alkaline Phosphatase: 65 IU/L (ref 44–121)
BUN/Creatinine Ratio: 12 (ref 9–20)
BUN: 12 mg/dL (ref 6–24)
Basophils Absolute: 0 10*3/uL (ref 0.0–0.2)
Basos: 1 %
Bilirubin Total: 0.3 mg/dL (ref 0.0–1.2)
Calcium: 9.1 mg/dL (ref 8.7–10.2)
Chloride: 103 mmol/L (ref 96–106)
Chol/HDL Ratio: 5 ratio (ref 0.0–5.0)
Cholesterol, Total: 211 mg/dL — ABNORMAL HIGH (ref 100–199)
Creatinine, Ser: 1 mg/dL (ref 0.76–1.27)
EOS (ABSOLUTE): 0.1 10*3/uL (ref 0.0–0.4)
Eos: 3 %
Estimated CHD Risk: 1 times avg. (ref 0.0–1.0)
Free Thyroxine Index: 2.2 (ref 1.2–4.9)
GGT: 17 IU/L (ref 0–65)
Globulin, Total: 2.8 g/dL (ref 1.5–4.5)
Glucose: 286 mg/dL — ABNORMAL HIGH (ref 70–99)
HDL: 42 mg/dL (ref >39)
Hematocrit: 42.9 % (ref 37.5–51.0)
Hemoglobin: 14.5 g/dL (ref 13.0–17.7)
Immature Grans (Abs): 0 10*3/uL (ref 0.0–0.1)
Immature Granulocytes: 0 %
Iron: 56 ug/dL (ref 38–169)
LDH: 174 IU/L (ref 121–224)
LDL Chol Calc (NIH): 150 mg/dL — ABNORMAL HIGH (ref 0–99)
Lymphocytes Absolute: 1.6 10*3/uL (ref 0.7–3.1)
Lymphs: 37 %
MCH: 29.2 pg (ref 26.6–33.0)
MCHC: 33.8 g/dL (ref 31.5–35.7)
MCV: 86 fL (ref 79–97)
Monocytes Absolute: 0.4 10*3/uL (ref 0.1–0.9)
Monocytes: 9 %
Neutrophils Absolute: 2.1 10*3/uL (ref 1.4–7.0)
Neutrophils: 50 %
Phosphorus: 3.3 mg/dL (ref 2.8–4.1)
Platelets: 199 10*3/uL (ref 150–450)
Potassium: 4.2 mmol/L (ref 3.5–5.2)
Prostate Specific Ag, Serum: 0.3 ng/mL (ref 0.0–4.0)
RBC: 4.97 x10E6/uL (ref 4.14–5.80)
RDW: 13.4 % (ref 11.6–15.4)
Sodium: 138 mmol/L (ref 134–144)
T3 Uptake Ratio: 32 % (ref 24–39)
T4, Total: 7 ug/dL (ref 4.5–12.0)
TSH: 1.62 u[IU]/mL (ref 0.450–4.500)
Total Protein: 7 g/dL (ref 6.0–8.5)
Triglycerides: 107 mg/dL (ref 0–149)
Uric Acid: 5.5 mg/dL (ref 3.8–8.4)
VLDL Cholesterol Cal: 19 mg/dL (ref 5–40)
WBC: 4.2 10*3/uL (ref 3.4–10.8)
eGFR: 88 mL/min/{1.73_m2} (ref >59)

## 2022-07-12 LAB — HGB A1C W/O EAG: Hgb A1c MFr Bld: 13.3 % — ABNORMAL HIGH (ref 4.8–5.6)

## 2022-07-17 ENCOUNTER — Encounter: Payer: Self-pay | Admitting: Physician Assistant

## 2022-07-17 ENCOUNTER — Ambulatory Visit: Payer: Self-pay | Admitting: Physician Assistant

## 2022-07-17 VITALS — Ht 73.0 in | Wt 198.0 lb

## 2022-07-17 DIAGNOSIS — I10 Essential (primary) hypertension: Secondary | ICD-10-CM

## 2022-07-17 DIAGNOSIS — Z0000 Encounter for general adult medical examination without abnormal findings: Secondary | ICD-10-CM

## 2022-07-17 DIAGNOSIS — E1165 Type 2 diabetes mellitus with hyperglycemia: Secondary | ICD-10-CM

## 2022-07-17 LAB — POCT URINALYSIS DIPSTICK
Bilirubin, UA: NEGATIVE
Blood, UA: POSITIVE
Glucose, UA: POSITIVE — AB
Ketones, UA: NEGATIVE
Leukocytes, UA: NEGATIVE
Nitrite, UA: NEGATIVE
Protein, UA: NEGATIVE
Spec Grav, UA: 1.01 (ref 1.010–1.025)
Urobilinogen, UA: 0.2 E.U./dL
pH, UA: 7 (ref 5.0–8.0)

## 2022-07-17 MED ORDER — SILDENAFIL CITRATE 100 MG PO TABS: 100.0000 mg | ORAL_TABLET | Freq: Every day | ORAL | 0 refills | Status: AC | PRN

## 2022-07-17 NOTE — Progress Notes (Signed)
Hasn't been using the Dexcom system because his phone won't link up to it.  AMD

## 2022-07-17 NOTE — Progress Notes (Signed)
[facility]  ____________________________________________   None    (approximate)  I have reviewed the triage vital signs and the nursing notes.   HISTORY  Chief Complaint Annual Exam   HPI Oscar Schultz is a 57 y.o. male patient presents for annual physical exam.  Patient admits to noncompliance with diabetic medications.  Patient to follow-up endocrinologist as directed in the past.  Patient realizes hemoglobin A1c is 13.3 and is amenable to endocrinologist referral.  Patient also requests refill of medication for erectile dysfunction.         Past Medical History:  Diagnosis Date   Aortic heart murmur    Degenerative arthritis of left knee    Diabetes mellitus    non compliant    ED (erectile dysfunction)    Elevated LDL cholesterol level    Hypertension    Low HDL (under 40)     Patient Active Problem List   Diagnosis Date Noted   Abnormal EKG 04/23/2022   Diabetes mellitus type 2 in nonobese 04/23/2022   Controlled type 2 diabetes mellitus with hyperglycemia, with long-term current use of insulin 11/07/2018   Essential hypertension 11/07/2018   Mixed hyperlipidemia with apolipoprotein E2 variant 11/07/2018    Past Surgical History:  Procedure Laterality Date   COLONOSCOPY  11/2014    Prior to Admission medications   Medication Sig Start Date End Date Taking? Authorizing Provider  amLODipine (NORVASC) 10 MG tablet Take 1 tablet (10 mg total) by mouth daily. 05/24/21  Yes Joni Reining, PA-C  aspirin 81 MG chewable tablet Chew by mouth.   Yes [provider]  atorvastatin (LIPITOR) 40 MG tablet Take 1 tablet (40 mg total) by mouth daily. 01/17/21  Yes Joni Reining, PA-C  dapagliflozin propanediol (FARXIGA) 10 MG TABS tablet Take 1 tablet (10 mg total) by mouth daily before breakfast. 01/17/21  Yes Joni Reining, PA-C  fexofenadine-pseudoephedrine (ALLEGRA-D) 60-120 MG 12 hr tablet Take 1 tablet by mouth 2  (two) times daily. 08/03/20  Yes Joni Reining, PA-C  gabapentin (NEURONTIN) 300 MG capsule Take 300 mg by mouth daily. 09/03/20  Yes [provider]  hydrochlorothiazide (HYDRODIURIL) 25 MG tablet Take 25 mg by mouth daily. 08/07/20  Yes [provider]  Insulin Glargine (BASAGLAR KWIKPEN) 100 UNIT/ML INJECT 15 UNITS SUBCUTANEOUSLY AT BEDTIME 06/02/22  Yes Joni Reining, PA-C  lisinopril (ZESTRIL) 20 MG tablet Take 1 tablet (20 mg total) by mouth daily. 01/17/21  Yes Joni Reining, PA-C  metFORMIN (GLUCOPHAGE-XR) 500 MG 24 hr tablet Take 500 mg by mouth 2 (two) times daily. 10/25/20  Yes [provider]  RELION PEN NEEDLE 31G/8MM 31G X 8 MM MISC daily. use as directed 02/11/19  Yes [provider]  sildenafil (VIAGRA) 100 MG tablet Take 100 mg by mouth daily as needed. 09/03/20  Yes [provider]  Continuous Blood Gluc Receiver (DEXCOM G7 RECEIVER) DEVI 1 Device by Does not apply route continuous. Patient not taking: Reported on 07/17/2022 04/14/22   Joni Reining, PA-C  Continuous Blood Gluc Sensor (DEXCOM G7 SENSOR) MISC Place 1 patch onto the skin every 14 (fourteen) days. Patient not taking: Reported on 07/17/2022 04/14/22   Joni Reining, PA-C    Allergies Patient has no known allergies.  Family History  Problem Relation Age of Onset   Heart disease Mother    COPD Father    Aneurysm Father        brian  Coronary artery disease Brother    Heart attack Brother    Diabetes Sister     Social History Social History   Tobacco Use   Smoking status: Never   Smokeless tobacco: Never  Substance Use Topics   Alcohol use: Yes    Alcohol/week: 2.0 standard drinks of alcohol    Types: 2 Cans of beer per week    Comment: light beer    Review of Systems Constitutional: No fever/chills Eyes: No visual changes. ENT: No sore throat. Cardiovascular: Denies chest pain. Respiratory: Denies shortness of breath. Gastrointestinal: No  abdominal pain.  No nausea, no vomiting.  No diarrhea.  No constipation. Genitourinary: Negative for dysuria. Musculoskeletal: Negative for back pain. Skin: Negative for rash. Neurological: Negative for headaches, focal weakness or numbness. Endocrine: Diabetes, hyperlipidemia, and hypertension  ____________________________________________   PHYSICAL EXAM:  VITAL SIGNS: Constitutional: Alert and oriented. Well appearing and in no acute distress. Eyes: Conjunctivae are normal. PERRL. EOMI. Head: Atraumatic. Nose: No congestion/rhinnorhea. Mouth/Throat: Mucous membranes are moist.  Oropharynx non-erythematous. Neck: No stridor.  No cervical spine tenderness to palpation. Hematological/Lymphatic/Immunilogical: No cervical lymphadenopathy. Cardiovascular: Normal rate, regular rhythm. Grossly normal heart sounds.  Good peripheral circulation. Respiratory: Normal respiratory effort.  No retractions. Lungs CTAB. Gastrointestinal: Soft and nontender. No distention. No abdominal bruits. No CVA tenderness. Genitourinary: Deferred Musculoskeletal: No lower extremity tenderness nor edema.  No joint effusions. Neurologic:  Normal speech and language. No gross focal neurologic deficits are appreciated. No gait instability. Skin:  Skin is warm, dry and intact. No rash noted. Psychiatric: Mood and affect are normal. Speech and behavior are normal.  ____________________________________________   LABS _          Component Ref Range & Units 6 d ago (07/11/22) 1 yr ago (01/11/21) 1 yr ago (08/14/20) 2 yr ago (11/29/19) 2 yr ago (08/19/19) 3 yr ago (09/20/18) 4 yr ago (02/16/18)  Hgb A1c MFr Bld 4.8 - 5.6 % 13.3 High  10.8 High  CM 15.1 High  CM 11.6 Abnormal  R 14.8 High  CM 13.5 High  CM 11.1 R  Comment:          Prediabetes: 5.7 - 6.4          Diabetes: >6.4          Glycemic control for adults with diabetes: <7.0  HbA1c POC (<> result, manual entry)         Resulting Agency [facility]               Component Ref Range & Units 6 d ago (07/11/22) 1 yr ago (01/11/21) 1 yr ago (08/14/20) 2 yr ago (08/19/19) 3 yr ago (09/20/18) 4 yr ago (02/16/18) 4 yr ago (08/27/17)  Glucose 70 - 99 mg/dL 914 High   782 High  R 348 High  R 309 High  R    Uric Acid 3.8 - 8.4 mg/dL 5.5  6.2 CM 5.9 CM 6.3 R, CM    Comment:            Therapeutic target for gout patients: <6.0  BUN 6 - 24 mg/dL R   Creatinine, Ser 0.76 - 1.27 mg/dL 9.56 2.13 0.86 5.78 4.69 1.0 R   eGFR >59 mL/min/1.73 88 98 97      BUN/Creatinine Ratio 9 - Sodium 134 - 144 mmol/L 138  137 138 140  Potassium 3.5 - 5.2 mmol/L 4.2  4.1 4.1 4.3    Chloride 96 - 106 mmol/L 103  100 100 101    Calcium 8.7 - 10.2 mg/dL 9.1  9.3 9.5 8.9    Phosphorus 2.8 - 4.1 mg/dL 3.3  3.0 3.1 3.0    Total Protein 6.0 - 8.5 g/dL 7.0  7.5 7.3 6.9    Albumin 3.8 - 4.9 g/dL 4.2  4.3 4.3 4.2    Globulin, Total 1.5 - 4.5 g/dL 2.8  3.2 3.0 2.7    Albumin/Globulin Ratio 1.2 - 2.2 1.5  1.3 1.4 1.6    Bilirubin Total 0.0 - 1.2 mg/dL 0.3  0.3 0.3 0.4    Alkaline Phosphatase 44 - 121 IU/L 65  69 65 R, CM 62 R    LDH 121 - 224 IU/L 174  175 188 164    AST 0 - 40 IU/L 13  13 15 12     ALT 0 - 44 IU/L 14  9 11 11     GGT 0 - 65 IU/L 17  17 18 20     Iron 38 - 169 ug/dL 56  65 71 96    Cholesterol, Total 100 - 199 mg/dL 409 High   811 High  914 High  255 High     Triglycerides 0 - 149 mg/dL 782  956 High  213 086 High   80 R  HDL >39 mg/dL 42  33 Low  40 29 Low   35 R  VLDL Cholesterol Cal 5 - 40 mg/dL 19  33 22 57 High     LDL Chol Calc (NIH) 0 - 99 mg/dL 578 High   469 High  629 High      Chol/HDL Ratio 0.0 - 5.0 ratio 5.0  6.7 High  CM 6.6 High  CM 8.8 High  CM    Comment:                                   T. Chol/HDL Ratio                                             Men  Women                               1/2 Avg.Risk  3.4    3.3                                    Avg.Risk  5.0    4.4                                2X Avg.Risk  9.6    7.1                                3X Avg.Risk 23.4   11.0  Estimated CHD Risk 0.0 - 1.0 times avg. 1.0  1.4 High  CM 1.4 High  CM 1.8 High  CM    Comment: The CHD Risk is based on the T. Chol/HDL ratio. Other factors  affect CHD Risk such as hypertension, smoking, diabetes, severe obesity, and family history of premature CHD.  TSH 0.450 - 4.500 uIU/mL 1.620  1.770 1.850 1.210    T4, Total 4.5 - 12.0 ug/dL 7.0  7.4 7.1 6.2    T3 Uptake Ratio 24 - 39 % 32  30 29 30     Free Thyroxine Index 1.2 - 4.9 2.2  2.2 2.1 1.9    Prostate Specific Ag, Serum 0.0 - 4.0 ng/mL 0.3  0.3 CM 0.3 CM 0.3 CM    Comment: Roche ECLIA methodology. According to the American Urological Association, Serum PSA should decrease and remain at undetectable levels after radical prostatectomy. The AUA defines biochemical recurrence as an initial PSA value 0.2 ng/mL or greater followed by a subsequent confirmatory PSA value 0.2 ng/mL or greater. Values obtained with different assay methods or kits cannot be used interchangeably. Results cannot be interpreted as absolute evidence of the presence or absence of malignant disease.  WBC 3.4 - 10.8 x10E3/uL 4.2  4.1 4.0 3.8    RBC 4.14 - 5.80 x10E6/uL 4.97  5.30 5.14 5.18    Hemoglobin 13.0 - 17.7 g/dL 16.1  09.6 04.5 40.9    Hematocrit 37.5 - 51.0 % 42.9  44.7 43.5 43.3    MCV 79 - 97 fL 86  84 85 84    MCH 26.6 - 33.0 pg 29.2  29.4 29.0 29.5    MCHC 31.5 - 35.7 g/dL 81.1  91.4 78.2 95.6    RDW 11.6 - 15.4 % 13.4  12.8 12.6 13.2    Platelets 150 - 450 x10E3/uL 199  260 226 226    Neutrophils Not Estab. % 50  50 46 44    Lymphs Not Estab. % 37  37 41 43    Monocytes Not Estab. % 9  9 10 9     Eos Not Estab. % 3  2 2 3     Basos Not Estab. % 1  1 1 1     Neutrophils Absolute 1.4 - 7.0 x10E3/uL 2.1  2.1 1.8 1.7    Lymphocytes Absolute 0.7 - 3.1 x10E3/uL 1.6  1.5 1.6 1.6     Monocytes Absolute 0.1 - 0.9 x10E3/uL 0.4  0.4 0.4 0.4    EOS (ABSOLUTE) 0.0 - 0.4 x10E3/uL 0.1  0.1 0.1 0.1    Basophils Absolute 0.0 - 0.2 x10E3/uL 0.0  0.1 0.0 0.0    Immature Granulocytes Not Estab. % 0  1 0 0    Immature Grans (Abs) 0.0 - 0.1 x10E3/uL 0.0  0.0 0.0 0.0    Resulting Agency [facility]                         Component Ref Range & Units 6 d ago (07/11/22) 1 yr ago (01/11/21) 1 yr ago (08/14/20) 2 yr ago (11/29/19) 2 yr ago (08/19/19) 3 yr ago (09/20/18) 4 yr ago (02/16/18)  Hgb A1c MFr Bld 4.8 - 5.6 % 13.3 High  10.8 High  CM 15.1 High  CM 11.6 Abnormal  R 14.8 High  CM 13.5 High  CM 11.1 R  Comment:          Prediabetes: 5.7 - 6.4          Diabetes: >6.4                ___________________________________________  EKG Left atrial enlargement at 77 bpm.  No change from previous EKG.  ____________________________________________    ____________________________________________   INITIAL IMPRESSION / ASSESSMENT AND PLAN  As part of my medical decision making, I reviewed the following data within the electronic MEDICAL RECORD NUMBER       No acute findings on physical exam.  Discussed uncontrolled diabetes patient amenable to reevaluation by endocrinologist.  Patient also given prescription for Viagra.      ____________________________________________   FINAL CLINICAL IMPRESSION  All physical exam with uncontrolled diabetes.  ED Discharge Orders          Ordered    Microalbumin / creatinine urine ratio        07/17/22 1525    POCT urinalysis dipstick        07/17/22 1525             Note:  This document was prepared using Dragon voice recognition software and may include unintentional dictation errors.

## 2022-07-18 NOTE — Addendum Note (Signed)
Addended by: Gardner Candle on: 07/18/2022 08:17 AM   Modules accepted: Orders

## 2022-07-19 LAB — MICROALBUMIN / CREATININE URINE RATIO
Creatinine, Urine: 25.7 mg/dL
Microalb/Creat Ratio: 74 mg/g creat — ABNORMAL HIGH (ref 0–29)
Microalbumin, Urine: 19.1 ug/mL

## 2023-03-12 ENCOUNTER — Other Ambulatory Visit: Payer: Self-pay

## 2023-03-12 DIAGNOSIS — M1712 Unilateral primary osteoarthritis, left knee: Secondary | ICD-10-CM | POA: Insufficient documentation

## 2023-03-12 MED ORDER — GABAPENTIN 300 MG PO CAPS: 300.0000 mg | ORAL_CAPSULE | Freq: Every day | ORAL | 3 refills | Status: DC

## 2023-04-30 ENCOUNTER — Ambulatory Visit: Payer: Self-pay | Admitting: Physician Assistant

## 2023-04-30 ENCOUNTER — Encounter: Payer: Self-pay | Admitting: Physician Assistant

## 2023-04-30 VITALS — BP 178/88 | HR 74 | Temp 97.5°F | Resp 16 | Ht 73.0 in | Wt 190.0 lb

## 2023-04-30 DIAGNOSIS — R059 Cough, unspecified: Secondary | ICD-10-CM

## 2023-04-30 LAB — POCT INFLUENZA A/B
Influenza A, POC: NEGATIVE
Influenza B, POC: NEGATIVE

## 2023-04-30 LAB — POC COVID19 BINAXNOW: SARS Coronavirus 2 Ag: NEGATIVE

## 2023-04-30 MED ORDER — PSEUDOEPH-BROMPHEN-DM 30-2-10 MG/5ML PO SYRP: 5.0000 mL | ORAL_SOLUTION | Freq: Four times a day (QID) | ORAL | 0 refills | Status: DC | PRN

## 2023-04-30 MED ORDER — AZITHROMYCIN 250 MG PO TABS: ORAL_TABLET | ORAL | 0 refills | Status: AC

## 2023-04-30 NOTE — Progress Notes (Signed)
   Subjective: Cough and nasal/chest congestion    Patient ID: Oscar Schultz, male    DOB: 02/14/1966, 58 y.o.   MRN: [id number]  HPI Patient complaining 1 week of nasal congestion, coughing, wheezing, and chest congestion.  States symptoms worse when laying down.  Denies fever/chills.  No recent travel or known contact with COVID-19 patient tested negative for COVID-19 and influenza today.   Review of Systems Diabetes, hyperlipidemia, and hypertension.    Objective:   Physical Exam BP 178/88BP. 178/88. Data is abnormal. Taken on 04/30/23 10:38 AM  Pulse Rate 74  Temp 97.5 F (36.4 C)Temp. 97.5 F (36.4 C). Data is abnormal. Taken on 04/30/23 10:38 AM  Weight 190 lb (86.2 kg)  Height 6' 1 (1.854 m)  Resp 16  SpO2 96 %  Negative COVID and influenza this morning. HEENT is remarkable edematous nasal turbinates with postnasal drainage. Neck is supple for lymphadenopathy or bruits. Lungs with right upper rhonchi breath sounds. Heart regular rate and rhythm.     Assessment & Plan: Pneumonia  Patient given a prescription for Z-Pak and Bromfed-DM.  Advised over-the-counter Tylenol/ibuprofen for fever or pain.

## 2023-04-30 NOTE — Progress Notes (Signed)
 C/O cough and taking everything for cold s/s for about a week.  Denies report of fever.  Tested negative for covid/flu this week.  Says cough is not keeping him awake at night but says congested and head full.  No coughing in exam room at this time.  Reports not taking any prescription meds since been sick as he's been taking all kinds of cold meds.  B/P rechecked twice and manual.  Reports not checking sugar at home nor using Dexcom as it won't link to phone and wants to get a new phone.

## 2023-06-12 ENCOUNTER — Other Ambulatory Visit: Payer: Self-pay | Admitting: Physician Assistant

## 2023-06-12 DIAGNOSIS — I10 Essential (primary) hypertension: Secondary | ICD-10-CM

## 2023-06-19 ENCOUNTER — Ambulatory Visit: Payer: Self-pay

## 2023-06-19 DIAGNOSIS — Z0000 Encounter for general adult medical examination without abnormal findings: Secondary | ICD-10-CM

## 2023-06-19 DIAGNOSIS — E1165 Type 2 diabetes mellitus with hyperglycemia: Secondary | ICD-10-CM

## 2023-06-19 LAB — POCT URINALYSIS DIPSTICK
Bilirubin, UA: NEGATIVE
Blood, UA: NEGATIVE
Glucose, UA: POSITIVE — AB
Ketones, UA: NEGATIVE
Leukocytes, UA: NEGATIVE
Nitrite, UA: NEGATIVE
Protein, UA: NEGATIVE
Spec Grav, UA: 1.02 (ref 1.010–1.025)
Urobilinogen, UA: 0.2 U/dL
pH, UA: 6 (ref 5.0–8.0)

## 2023-06-22 LAB — HGB A1C W/O EAG: Hgb A1c MFr Bld: 14.8 % — ABNORMAL HIGH (ref 4.8–5.6)

## 2023-06-22 LAB — MICROALBUMIN / CREATININE URINE RATIO
Creatinine, Urine: 81 mg/dL
Microalb/Creat Ratio: 132 mg/g{creat} — ABNORMAL HIGH (ref 0–29)
Microalbumin, Urine: 107.3 ug/mL

## 2023-06-22 LAB — CMP12+LP+TP+TSH+6AC+PSA+CBC…
ALT: 9 IU/L (ref 0–44)
AST: 12 IU/L (ref 0–40)
Albumin: 3.9 g/dL (ref 3.8–4.9)
Alkaline Phosphatase: 62 IU/L (ref 44–121)
BUN/Creatinine Ratio: 17 (ref 9–20)
BUN: 18 mg/dL (ref 6–24)
Basophils Absolute: 0 10*3/uL (ref 0.0–0.2)
Basos: 1 %
Bilirubin Total: 0.3 mg/dL (ref 0.0–1.2)
Calcium: 8.8 mg/dL (ref 8.7–10.2)
Chloride: 100 mmol/L (ref 96–106)
Chol/HDL Ratio: 6 ratio — ABNORMAL HIGH (ref 0.0–5.0)
Cholesterol, Total: 252 mg/dL — ABNORMAL HIGH (ref 100–199)
Creatinine, Ser: 1.04 mg/dL (ref 0.76–1.27)
EOS (ABSOLUTE): 0.1 10*3/uL (ref 0.0–0.4)
Eos: 2 %
Estimated CHD Risk: 1.3 times avg. — ABNORMAL HIGH (ref 0.0–1.0)
Free Thyroxine Index: 2.2 (ref 1.2–4.9)
GGT: 17 IU/L (ref 0–65)
Globulin, Total: 2.9 g/dL (ref 1.5–4.5)
Glucose: 322 mg/dL — ABNORMAL HIGH (ref 70–99)
HDL: 42 mg/dL (ref >39)
Hematocrit: 42.6 % (ref 37.5–51.0)
Hemoglobin: 14.3 g/dL (ref 13.0–17.7)
Immature Grans (Abs): 0 10*3/uL (ref 0.0–0.1)
Immature Granulocytes: 0 %
Iron: 57 ug/dL (ref 38–169)
LDH: 167 IU/L (ref 121–224)
LDL Chol Calc (NIH): 188 mg/dL — ABNORMAL HIGH (ref 0–99)
Lymphocytes Absolute: 1.8 10*3/uL (ref 0.7–3.1)
Lymphs: 41 %
MCH: 29.2 pg (ref 26.6–33.0)
MCHC: 33.6 g/dL (ref 31.5–35.7)
MCV: 87 fL (ref 79–97)
Monocytes Absolute: 0.4 10*3/uL (ref 0.1–0.9)
Monocytes: 8 %
Neutrophils Absolute: 2.1 10*3/uL (ref 1.4–7.0)
Neutrophils: 48 %
Phosphorus: 3.5 mg/dL (ref 2.8–4.1)
Platelets: 226 10*3/uL (ref 150–450)
Potassium: 4.1 mmol/L (ref 3.5–5.2)
Prostate Specific Ag, Serum: 0.3 ng/mL (ref 0.0–4.0)
RBC: 4.89 x10E6/uL (ref 4.14–5.80)
RDW: 13.3 % (ref 11.6–15.4)
Sodium: 137 mmol/L (ref 134–144)
T3 Uptake Ratio: 32 % (ref 24–39)
T4, Total: 6.9 ug/dL (ref 4.5–12.0)
TSH: 2.45 u[IU]/mL (ref 0.450–4.500)
Total Protein: 6.8 g/dL (ref 6.0–8.5)
Triglycerides: 122 mg/dL (ref 0–149)
Uric Acid: 6.7 mg/dL (ref 3.8–8.4)
VLDL Cholesterol Cal: 22 mg/dL (ref 5–40)
WBC: 4.4 10*3/uL (ref 3.4–10.8)
eGFR: 83 mL/min/{1.73_m2} (ref >59)

## 2023-07-01 ENCOUNTER — Encounter: Payer: Self-pay | Admitting: Physician Assistant

## 2023-07-01 ENCOUNTER — Ambulatory Visit: Payer: BC Managed Care – PPO | Admitting: Physician Assistant

## 2023-07-01 VITALS — BP 160/90 | HR 79 | Temp 97.6°F | Resp 16 | Ht 73.0 in | Wt 193.0 lb

## 2023-07-01 DIAGNOSIS — Z0000 Encounter for general adult medical examination without abnormal findings: Secondary | ICD-10-CM

## 2023-07-01 DIAGNOSIS — E119 Type 2 diabetes mellitus without complications: Secondary | ICD-10-CM

## 2023-07-01 DIAGNOSIS — E1165 Type 2 diabetes mellitus with hyperglycemia: Secondary | ICD-10-CM

## 2023-07-01 NOTE — Progress Notes (Signed)
 [facility] [place]  ____________________________________________   None    (approximate)  I have reviewed the triage vital signs and the nursing notes.   HISTORY  Chief Complaint Annual Exam   HPI Oscar Schultz is a 58 y.o. male patient presents for annual physical exam.  Patient is aware of elevated hemoglobin A1c.  Patient states compliant with medicine but not compliant with diet.  Patient states trying to gain weight so eating is very full.  A few years ago the patient was taken Ozempic which was improving his hemoglobin A1c.  Patient states previous insurance stopped paying for the medication.        Past Medical History:  Diagnosis Date   Aortic heart murmur    Degenerative arthritis of left knee    Diabetes mellitus (HCC)    non compliant    ED (erectile dysfunction)    Elevated LDL cholesterol level    Hypertension    Low HDL (under 40)     Patient Active Problem List   Diagnosis Date Noted   Degenerative arthritis of left knee 03/12/2023   Abnormal EKG 04/23/2022   Diabetes mellitus type 2 in nonobese (HCC) 04/23/2022   Diabetic peripheral neuropathy (HCC) 08/07/2020   Type 2 diabetes mellitus with hyperglycemia (HCC) 08/07/2020   Controlled type 2 diabetes mellitus with hyperglycemia, with long-term current use of insulin (HCC) 11/07/2018   Essential hypertension 11/07/2018   Mixed hyperlipidemia with apolipoprotein E2 variant 11/07/2018    Past Surgical History:  Procedure Laterality Date   COLONOSCOPY  11/2014    Prior to Admission medications   Medication Sig Start Date End Date Taking? Authorizing Provider  amLODipine (NORVASC) 10 MG tablet Take 1 tablet by mouth once daily 06/12/23   Joni Reining, PA-C  aspirin 81 MG chewable tablet Chew by mouth.    [provider]  atorvastatin (LIPITOR) 40 MG tablet Take 1 tablet (40 mg total) by mouth daily. 01/17/21   Joni Reining, PA-C   brompheniramine-pseudoephedrine-DM 30-2-10 MG/5ML syrup Take 5 mLs by mouth 4 (four) times daily as needed. 04/30/23   Joni Reining, PA-C  Continuous Blood Gluc Sensor (DEXCOM G7 SENSOR) MISC Place 1 patch onto the skin every 14 (fourteen) days. Patient not taking: Reported on 04/30/2023 04/14/22   Joni Reining, PA-C  dapagliflozin propanediol (FARXIGA) 10 MG TABS tablet Take 1 tablet (10 mg total) by mouth daily before breakfast. 01/17/21   Joni Reining, PA-C  gabapentin (NEURONTIN) 300 MG capsule Take 1 capsule (300 mg total) by mouth daily. 03/12/23   Joni Reining, PA-C  hydrochlorothiazide (HYDRODIURIL) 25 MG tablet Take 25 mg by mouth daily. 08/07/20   [provider]  Insulin Glargine [facility] KWIKPEN) 100 UNIT/ML INJECT 15 UNITS SUBCUTANEOUSLY AT BEDTIME 06/02/22   Joni Reining, PA-C  lisinopril (ZESTRIL) 20 MG tablet Take 1 tablet by mouth once daily 06/12/23   Joni Reining, PA-C  metFORMIN (GLUCOPHAGE-XR) 500 MG 24 hr tablet Take 500 mg by mouth 2 (two) times daily. Patient not taking: Reported on 04/30/2023 10/25/20   [provider]  RELION PEN NEEDLE 31G/8MM 31G X 8 MM MISC daily. use as directed 02/11/19   [provider]  sildenafil (VIAGRA) 100 MG tablet Take 1 tablet (100 mg total) by mouth daily as needed. 07/17/22   Joni Reining, PA-C    Allergies Patient has no known allergies.  Family History  Problem Relation Age of Onset  Heart disease Mother    COPD Father    Aneurysm Father        brian   Coronary artery disease Brother    Heart attack Brother    Diabetes Sister     Social History Social History   Tobacco Use   Smoking status: Never   Smokeless tobacco: Never  Substance Use Topics   Alcohol use: Yes    Alcohol/week: 2.0 standard drinks of alcohol    Types: 2 Cans of beer per week    Comment: light beer    Review of Systems Constitutional: No fever/chills Eyes: No visual changes. ENT: No sore  throat. Cardiovascular: Denies chest pain. Respiratory: Denies shortness of breath. Gastrointestinal: No abdominal pain.  No nausea, no vomiting.  No diarrhea.  No constipation. Genitourinary: Negative for dysuria.  Erectile dysfunction Musculoskeletal: Negative for back pain. Skin: Negative for rash. Neurological: Negative for headaches, focal weakness or numbness. Endocrine: Diabetes, hyperlipidemia, hypertension  ____________________________________________   PHYSICAL EXAM:  VITAL SIGNS:  07/01/2023   8:56 AM    BP 160/90BP. 160/90. Data is abnormal. Taken on 07/01/23 8:56 AM  Cuff Size Normal  Pulse Rate 79  Temp 97.6 F (36.4 C)  Temp Source Temporal  Weight 193 lb (87.5 kg)  Height 6\' 1"  (1.854 m)  Resp 16  SpO2 99 %   BMI: 25.46 kg/m2  BSA: 2.12 m2   Constitutional: Alert and oriented. Well appearing and in no acute distress. Eyes: Conjunctivae are normal. PERRL. EOMI. Head: Atraumatic. Nose: No congestion/rhinnorhea. Mouth/Throat: Mucous membranes are moist.  Oropharynx non-erythematous. Neck: No stridor. No cervical spine tenderness to palpation. Hematological/Lymphatic/Immunilogical: No cervical lymphadenopathy. Cardiovascular: Normal rate, regular rhythm. Grossly normal heart sounds.  Good peripheral circulation.  Respiratory: Normal respiratory effort.  No retractions. Lungs CTAB. Gastrointestinal: Soft and nontender. No distention. No abdominal bruits. No CVA tenderness. Genitourinary:  Musculoskeletal: No lower extremity tenderness nor edema.  No joint effusions. Neurologic:  Normal speech and language. No gross focal neurologic deficits are appreciated. No gait instability. Skin:  Skin is warm, dry and intact. No rash noted. Psychiatric: Mood and affect are normal. Speech and behavior are normal.  ____________________________________________   LABS         Component Ref Range & Units (hover) 12 d ago 11 mo ago 2 yr ago 3 yr ago 4 yr ago  Color, UA  yellow Light Yellow Yellow yellow Yellow  Clarity, UA clear Clear Clear clear Clear  Glucose, UA Positive Abnormal  Positive Abnormal  CM Positive Abnormal  CM Positive Abnormal  CM Positive Abnormal  CM  Comment: 3+  Bilirubin, UA neg Negative Negative negative Negative  Ketones, UA neg Negative Negative negative 5  Spec Grav, UA 1.020 1.010 1.015 1.020 1.020  Blood, UA neg Positive CM Negative negatve Negative  pH, UA 6.0 7.0 5.5 5.5 6.0  Protein, UA Negative Negative Negative Negative Negative  Urobilinogen, UA 0.2 0.2 0.2 0.2 0.2  Nitrite, UA neg Negative Negative negative Negative  Leukocytes, UA Negative Negative Negative Negative Negative  Appearance       Odor                  V            Component Ref Range & Units (hover) 12 d ago (06/19/23) 11 mo ago (07/11/22) 2 yr ago (01/11/21) 2 yr ago (08/14/20) 3 yr ago (08/19/19) 4 yr ago (09/20/18) 5 yr ago (02/16/18)  Glucose 322 High  286 High  324 High  R 348 High  R 309 High  R   Uric Acid 6.7 5.5 CM  6.2 CM 5.9 CM 6.3 R, CM   Comment:            Therapeutic target for gout patients: <6.0  BUN 18 12 15 13 14 15 18  R  Creatinine, Ser 1.04 1.00 0.92 0.93 1.04 0.97 1.0 R  eGFR 83 88 98 97     BUN/Creatinine Ratio 17 12 16 14 13 15    Sodium 137 138  137 138 140   Potassium 4.1 4.2  4.1 4.1 4.3   Chloride 100 103  100 100 101   Calcium 8.8 9.1  9.3 9.5 8.9   Phosphorus 3.5 3.3  3.0 3.1 3.0   Total Protein 6.8 7.0  7.5 7.3 6.9   Albumin 3.9 4.2  4.3 4.3 4.2   Globulin, Total 2.9 2.8  3.2 3.0 2.7   Bilirubin Total 0.3 0.3  0.3 0.3 0.4   Alkaline Phosphatase 62 65  69 65 R, CM 62 R   LDH 167 174  175 188 164   AST 12 13  13 15 12    ALT 9 14  9 11 11    GGT 17 17  17 18 20    Iron 57 56  65 71 96   Cholesterol, Total 252 High  211 High   222 High  265 High  255 High    Triglycerides 122 107  180 High  123 284 High    HDL 42 42  33 Low  40 29 Low    VLDL Cholesterol Cal 22 19  33 22 57 High    LDL Chol Calc (NIH) 188  High  150 High   156 High  203 High     Chol/HDL Ratio 6.0 High  5.0 CM  6.7 High  CM 6.6 High  CM 8.8 High  CM   Comment:                                   T. Chol/HDL Ratio                                             Men  Women                               1/2 Avg.Risk  3.4    3.3                                   Avg.Risk  5.0    4.4                                2X Avg.Risk  9.6    7.1                                3X Avg.Risk 23.4   11.0  Estimated CHD Risk 1.3 High  1.0 CM  1.4 High  CM 1.4 High  CM 1.8 High  CM   Comment: The CHD Risk  is based on the T. Chol/HDL ratio. Other factors affect CHD Risk such as hypertension, smoking, diabetes, severe obesity, and family history of premature CHD.  TSH 2.450 1.620  1.770 1.850 1.210   T4, Total 6.9 7.0  7.4 7.1 6.2   T3 Uptake Ratio 32 32  30 29 30    Free Thyroxine Index 2.2 2.2  2.2 2.1 1.9   Prostate Specific Ag, Serum 0.3 0.3 CM  0.3 CM 0.3 CM 0.3 CM   Comment: Roche ECLIA methodology. According to the American Urological Association, Serum PSA should decrease and remain at undetectable levels after radical prostatectomy. The AUA defines biochemical recurrence as an initial PSA value 0.2 ng/mL or greater followed by a subsequent confirmatory PSA value 0.2 ng/mL or greater. Values obtained with different assay methods or kits cannot be used interchangeably. Results cannot be interpreted as absolute evidence of the presence or absence of malignant disease.  WBC 4.4 4.2  4.1 4.0 3.8   RBC 4.89 4.97  5.30 5.14 5.18   Hemoglobin 14.3 14.5  15.6 14.9 15.3   Hematocrit 42.6 42.9  44.7 43.5 43.3   MCV 87 86  84 85 84   MCH 29.2 29.2  29.4 29.0 29.5   MCHC 33.6 33.8  34.9 34.3 35.3   RDW 13.3 13.4  12.8 12.6 13.2   Platelets 226 199  260 226 226   Neutrophils 48 50  50 46 44   Lymphs 41 37  37 41 43   Monocytes 8 9  9 10 9    Eos 2 3  2 2 3    Basos 1 1  1 1 1    Neutrophils Absolute 2.1 2.1  2.1 1.8 1.7   Lymphocytes Absolute 1.8  1.6  1.5 1.6 1.6   Monocytes Absolute 0.4 0.4  0.4 0.4 0.4   EOS (ABSOLUTE) 0.1 0.1  0.1 0.1 0.1   Basophils Absolute 0.0 0.0  0.1 0.0 0.0   Immature Granulocytes 0 0  1 0 0   Immature Grans (Abs) 0.0 0.0  0.0 0.0 0.0   Resulting Agency [facility]          View All Conversations on this Encounter                 Component Ref Range & Units (hover) 12 d ago (06/19/23) 11 mo ago (07/11/22) 2 yr ago (01/11/21) 2 yr ago (08/14/20) 3 yr ago (11/29/19) 3 yr ago (08/19/19) 4 yr ago (09/20/18)  Hgb A1c MFr Bld 14.8 High  13.3 High  CM 10.8 High  CM 15.1 High  CM 11.6 Abnormal  R 14.8 High  CM 13.5 High  CM  Comment:          Prediabetes: 5.7 - 6.4          Diabetes: >6.4          Glycemic control for adults with diabetes: <7.0  HbA1c POC (<> result, manual entry)         Resulting Agency [facility]  [facility]          View All Conversations on this Encounter                  Component Ref Range & Units (hover) 12 d ago 11 mo ago 2 yr ago 3 yr ago 4 yr ago  Creatinine, Urine 81.0 25.7 49.9 119.9 91.9  Microalbumin, Urine 107.3 19.1 <3.0 10.3 4.0  Microalb/Creat Ratio  132 High  74 High  CM <6 CM 9 CM 4 CM  Comment:                        Normal:                0 -  29                        Moderately increased: 30 - 300                        Severely increased:       >300           ____________________________________________  EKG Sinus rhythm at 77 bpm  ____________________________________________    ____________________________________________   INITIAL IMPRESSION / ASSESSMENT AND PLAN  As part of my medical decision making, I reviewed the following data within the electronic MEDICAL RECORD NUMBER Notes from prior ED visits and  Controlled Substance Database      No acute findings on physical exam and EKG.  Patient hemoglobin levels are markedly increased from last visit at 14.8 from 13.3.Marland Kitchen   Patient cholesterol has also increased to 252 from 211.  Advised consult to endocrinology for definitive evaluation and treatment.      ____________________________________________   FINAL CLINICAL IMPRESSION Uncontrolled diabetes and hyperlipidemia.   ED Discharge Orders     None        Note:  This document was prepared using Dragon voice recognition software and may include unintentional dictation errors.

## 2023-07-01 NOTE — Addendum Note (Signed)
 Addended by: Gardner Candle on: 07/01/2023 03:09 PM   Modules accepted: Orders

## 2023-07-01 NOTE — Progress Notes (Signed)
 Pt presents today to complete physical, pt has some concerns he wants to mention.

## 2023-08-20 ENCOUNTER — Other Ambulatory Visit: Payer: Self-pay

## 2023-08-20 DIAGNOSIS — E1142 Type 2 diabetes mellitus with diabetic polyneuropathy: Secondary | ICD-10-CM

## 2023-08-20 DIAGNOSIS — M1712 Unilateral primary osteoarthritis, left knee: Secondary | ICD-10-CM

## 2023-08-20 MED ORDER — GABAPENTIN 300 MG PO CAPS: 300.0000 mg | ORAL_CAPSULE | Freq: Two times a day (BID) | ORAL | 3 refills | Status: AC

## 2023-09-26 ENCOUNTER — Other Ambulatory Visit: Payer: Self-pay | Admitting: Physician Assistant

## 2023-09-28 ENCOUNTER — Telehealth: Payer: Self-pay

## 2023-09-28 NOTE — Telephone Encounter (Signed)
 Tanda Sharps, PA-C received Rx refill request for Farxiga  10 mg from [facility] through [employer].  Ron requested I contact Stephone because he saw Endocrinologist on 07/23/2023 & was supposed to follow-up 6 weeks later with Dexcom readings & Medication.  Called [facility].  He said he thought he was to follow-up with Endo in August 2025.  Asked if he had an August appointment because I cannot see one in Epic for him.    Advised him that Ron will refill the medication once Endo has determined the therapeutic dose for him.  Johanan verbalized understanding & states he will call Endo's office.

## 2023-10-26 NOTE — Progress Notes (Signed)
 Chief complaint: Diabetes  History of present illness: Oscar Schultz is 58 y.o. male seen in follow up for type II diabetes. He was last seen on 07/23/2023. He is accompanied by his wife, Veva, today.  His Hb A1c is now 10.5%, which is improved from prior (was 14.8% in 05/2023). He is now using a DexCom G7 sensor to monitor his sugars (uses a manual receiver). Data was downloaded and reviewed. Over the last 14 days, his average sugar was 293 mg/dl. He is in range 5%, high 25% and very high 70%. No hypoglycemia. Pattern interpretation shows: sugars are elevated most of the day and night. There are times when his sugars drop overnight (but doesn't get <150 at any point in time). He is prescribed: Basaglar  30 units QHS, Farxiga  10 mg QD, metformin 500 mg QD, and glipizide 10 mg QD. He does admit to missing his Basaglar  often and doesn't take it at any particular time (sometimes takes in the evenings, other times takes in the morning). He states he was previously taking Ozempic , which worked well, but was told to stop it for some reason. He would like to get back on it eventually if it is affordable. He denies h/o side effects, pancreatitis or personal/family h/o MTC or MEN syndrome.  He denies any recent episodes of hypoglycemia, but is aware of the signs/symptoms to look for. He endorses polydipsia, polyuria, decreased libido, low energy and blurred vision.  Review of his diet shows he and his wife have made a significant effort to improve his diet to better control his diabetes over the last few months and has limited white carbs, potatoes, sugary beverages, and candies. They instead snack on fruit and yogurt and he does eat a snack before bed (popcorn or peanut butter).    Diabetes is complicated by microalbuminuria and he has noticed some intermittent tingling in his fingertips at times. Blood pressure is good in office today. Lipids are not well controlled, but he states he has been working on his diet  and reducing fried/fatty foods. He needs refills of his lisinopril  and atorvastatin .  He has routine eye exams at [facility].   Past Medical History:  Diagnosis Date  . Diabetes mellitus (CMS/HHS-HCC)   . History of abnormal electrocardiogram 07/31/2004   normal sinus rhythm with T wave inversion in the septal and lateral leads  . Hyperlipidemia   . Hypertension     Family History  Problem Relation Name Age of Onset  . Myocardial Infarction (Heart attack) Mother    . Heart disease Mother    . High blood pressure (Hypertension) Mother    . Diabetes Sister    . High blood pressure (Hypertension) Sister    . Hyperthyroidism Sister      Outpatient Medications Marked as Taking for the 10/26/23 encounter (Office Visit) with Brendia Calton Squires, PA  Medication Sig Dispense Refill  . amLODIPine  (NORVASC ) 10 MG tablet Take 10 mg by mouth once daily.    SABRA aspirin 81 MG chewable tablet Take 81 mg by mouth.    . atorvastatin  (LIPITOR) 40 MG tablet Take 40 mg by mouth       . BASAGLAR  KWIKPEN U-100 INSULIN pen injector (concentration 100 units/mL) Inject 15 Units subcutaneously nightly 15 mL 3  . gabapentin  (NEURONTIN ) 300 MG capsule Take 300 mg by mouth once daily    . glipiZIDE (GLUCOTROL XL) 10 MG XL tablet Take 10 mg by mouth once daily    . lisinopril  (PRINIVIL ,ZESTRIL ) 40 MG tablet  Take 40 mg by mouth once daily.    . metFORMIN (GLUCOPHAGE) 500 MG tablet Take 500 mg by mouth daily with breakfast    . pravastatin (PRAVACHOL) 20 MG tablet Take 20 mg by mouth nightly.    . sildenafiL  (VIAGRA ) 100 MG tablet Take 100 mg by mouth once daily as needed      Exam: BP 124/84   Pulse 72   Ht 185.4 cm (6' 1)   Wt 87.5 kg (193 lb)   SpO2 98%   BMI 25.46 kg/m  GEN: well developed, well nourished, in NAD. HEENT: No proptosis. EOMI. No lid lag or stare.  NEURO: PERRL.  PSYC: alert and oriented, good insight   Labs  Lab Results  Component Value Date   HGBA1C 10.5 (H) 10/26/2023    HGBA1C 13.9 (H) 04/23/2022    Assessment: 1. Type 2 diabetes mellitus with hyperglycemia, with long-term current use of insulin (CMS/HHS-HCC)   2. Type 2 diabetes mellitus with diabetic neuropathy, with long-term current use of insulin (CMS/HHS-HCC)   3. Type 2 diabetes mellitus with diabetic microalbuminuria, with long-term current use of insulin (CMS/HHS-HCC)   4. Hyperlipidemia associated with type 2 diabetes mellitus (CMS/HHS-HCC)   5. Hypertension associated with type 2 diabetes mellitus (CMS/HHS-HCC)     Plan: - Diabetes is uncontrolled, but improved since last visit. Discussed the importance of medication compliance and strategies to improve this. - Increase Basaglar  to 35 units QHS. Refills sent.  - Continue Farxiga  10 mg QD, metformin 500 mg QD and glipizide 10 mg QD. May also consider re-starting Ozempic  at his next visit if needed. - He would benefit greatly from CGMS use given his long-standing h/o poorly controlled diabetes and use of insulin and sulfonylurea which can cause sudden hypoglycemia. Instructed to monitor blood sugars at least 4x/day through [facility] sensors. He is compliant with checking blood sugars regularly as prescribed. Reviewed target glucose ranges.  Reminded to bring blood sugar log and/or meter/phone to every visit to review data. Advised about hypoglycemia, what it is, signs/symptoms and appropriate treatment. Instructed him to notify the office if he has any issues with low blood sugars (<70). -  Continue statin and ACE for lipids and renal protection. Refills sent. Will monitor urine microalbumin periodically, which should improve with lower Hb A1c.  -  Continue annual eye exams. He is up to date on this. -  Follow up in 3 months, or sooner should any other issues arise, to review CGMS data and recheck STAT A1c.   Attestation Statement:   I personally performed the service, non-incident to. (WP)   CASSANDRA BUEL COHN, PA

## 2023-12-16 NOTE — Progress Notes (Signed)
 Oscar Schultz                                          MRN: [id number]   12/16/2023   The [facility] Quality Team Specialist reviewed this patient medical record for the purposes of chart review for care gap closure. The following were reviewed: chart review for care gap closure-diabetic eye exam.    [facility]BCI Quality Team

## 2023-12-16 NOTE — Progress Notes (Signed)
 Oscar Schultz                                          MRN: [id number]   12/16/2023   The [facility]BCI Quality Team Specialist reviewed this patient medical record for the purposes of chart review for care gap closure. The following were reviewed: chart review for care gap closure-glycemic status assessment.    [facility]BCI Quality Team

## 2023-12-17 ENCOUNTER — Encounter: Payer: Self-pay | Admitting: Physician Assistant

## 2023-12-17 ENCOUNTER — Ambulatory Visit: Payer: Self-pay | Admitting: Physician Assistant

## 2023-12-17 VITALS — BP 160/90 | HR 73 | Temp 98.8°F | Resp 16

## 2023-12-17 DIAGNOSIS — R52 Pain, unspecified: Secondary | ICD-10-CM

## 2023-12-17 DIAGNOSIS — R051 Acute cough: Secondary | ICD-10-CM

## 2023-12-17 DIAGNOSIS — R0981 Nasal congestion: Secondary | ICD-10-CM

## 2023-12-17 LAB — POCT INFLUENZA A/B
Influenza A, POC: NEGATIVE
Influenza B, POC: NEGATIVE

## 2023-12-17 LAB — POC COVID19 BINAXNOW: SARS Coronavirus 2 Ag: NEGATIVE

## 2023-12-17 MED ORDER — KETOROLAC TROMETHAMINE 10 MG PO TABS: 10.0000 mg | ORAL_TABLET | Freq: Four times a day (QID) | ORAL | 0 refills | Status: AC | PRN

## 2023-12-17 MED ORDER — KETOROLAC TROMETHAMINE 30 MG/ML IJ SOLN
30.0000 mg | Freq: Once | INTRAMUSCULAR | Status: AC
  Administered 2023-12-17: 30 mg via INTRAMUSCULAR

## 2023-12-17 MED ORDER — FEXOFENADINE-PSEUDOEPHED ER 60-120 MG PO TB12: 1.0000 | ORAL_TABLET | Freq: Two times a day (BID) | ORAL | 0 refills | Status: DC

## 2023-12-17 NOTE — Addendum Note (Signed)
 Addended by: ELUTERIO JENKINS HERO on: 12/17/2023 11:52 AM   Modules accepted: Orders

## 2023-12-17 NOTE — Progress Notes (Signed)
   Subjective: Body aches and nasal congestion    Patient ID: Oscar Schultz, male    DOB: 1965/07/15, 58 y.o.   MRN: [id number]  HPI Patient reports 2 days of generalized body ache and nasal congestion.  States he has occasional cough for frontal headache.  Patient denies fever/chills associated with complaint.  States his blood pressure is high because he did not take his medication this morning.  Patient also states he has not eaten this morning.  Patient is a type II diabetic followed by endocrinologist.   Review of Systems Diabetes, hyperlipidemia, tension.    Objective:   Physical Exam BP 172/103 160/90  BP Location Left Arm Left Arm  Patient Position Sitting Sitting  Cuff Size Large Large  Pulse Rate 73 --  Temp 98.8 F (37.1 C) --  Temp Source Oral --  Resp 16 --  SpO2 98 %   Patient appears fatigued. HEENT is remarkable for bilateral maxillary gargled and edematous nasal turbinates. Neck is supple without lymphadenopathy or bruits. Lungs are clear to auscultation.   Heart is regular rate and rhythm. Patient is negative for influenza and COVID-19.     Assessment & Plan: Myalgia and sinus congestion  Patient given Toradol  30 mg IM.  Patient given a prescription for Allegra-D and continue oral Toradol  for 5 days.  Return back to clinic if no treatment or worsening complaint.

## 2023-12-17 NOTE — Progress Notes (Signed)
 S/Sx started two days ago: Generalized bodyaches Denies fever, nasal drainage, ear discomfort & sore throat Headache Sinus congestion Occassional cough Neck hurting  Taking OTC Coricidin  States he hasn't eaten anything today or taken his meds so that might be why BP is up.  States still seeing Endocrinologist - reports that his A1c has gone down 4 points & that other than today he usually feels a lot better.  He is using the CGM now.

## 2024-01-01 ENCOUNTER — Other Ambulatory Visit: Payer: Self-pay

## 2024-01-01 ENCOUNTER — Ambulatory Visit: Admitting: Physician Assistant

## 2024-01-01 DIAGNOSIS — R600 Localized edema: Secondary | ICD-10-CM

## 2024-01-01 DIAGNOSIS — I10 Essential (primary) hypertension: Secondary | ICD-10-CM

## 2024-01-01 MED ORDER — HYDROCHLOROTHIAZIDE 25 MG PO TABS: 25.0000 mg | ORAL_TABLET | ORAL | 0 refills | Status: DC | PRN

## 2024-01-01 MED ORDER — AMLODIPINE BESYLATE 10 MG PO TABS: 10.0000 mg | ORAL_TABLET | Freq: Every day | ORAL | 0 refills | Status: AC

## 2024-01-04 ENCOUNTER — Ambulatory Visit: Payer: Self-pay | Admitting: Physician Assistant

## 2024-01-04 ENCOUNTER — Encounter: Payer: Self-pay | Admitting: Physician Assistant

## 2024-01-04 VITALS — BP 149/94 | HR 84 | Temp 97.5°F | Resp 16 | Wt 199.0 lb

## 2024-01-04 DIAGNOSIS — R600 Localized edema: Secondary | ICD-10-CM

## 2024-01-04 DIAGNOSIS — Z760 Encounter for issue of repeat prescription: Secondary | ICD-10-CM

## 2024-01-04 NOTE — Progress Notes (Signed)
   Subjective: Medication refill and bilateral ankle edema    Patient ID: Oscar Schultz, male    DOB: [birth year], 58 y.o.   MRN: [id number]  HPI Patient presents for request for refill of hydrochlorothiazide as part of his hypertension regimen of medications.  Patient states he ran out of medication about a week ago.  Patient noticed edema to the bilateral ankle, left greater than right.  Also noticing the patient records that he has not followed up with cardiology as directed from his visit last year.  (2/1/202)   Review of Systems Diabetes, hyperlipidemia, and hypertension.    Objective:   Physical Exam BP 149/94  Pulse Rate 84  Temp 97.5 F (36.4 C)  Weight 199 lb (90.3 kg)  Resp 16  SpO2 95 %  HEENT is unremarkable. Neck is supple pharmacotherapy or bruits. Lungs are clear to auscultation. Heart is regular rate and rhythm. Examination of the lower extremities reveals peripheral edema bilateral ankle, left greater than right.       Assessment & Plan: Medication refill and peripheral edema.  Discussed reasons for peripheral edema that include amlodipine  and absence of diuretic. Patient hydrochlorothiazide was refilled.  Patient advised to wear elastic support socks.  Patient advised to contact cardiology for definitive evaluation and follow-up.

## 2024-01-04 NOTE — Progress Notes (Signed)
 Reports a non de script amount of time of bilateral lower leg edema, but stated doesn't know how long.  He took hydrochlorothiazide this weekend after picking up the temporary script.  Stated he wore AES yesterday onlly and not observed wearing today.  Denies any dyspnea and has not followed up recently with cardiology.  Stated taking meds as prescribed on list.

## 2024-01-07 ENCOUNTER — Other Ambulatory Visit: Payer: Self-pay

## 2024-01-07 DIAGNOSIS — R600 Localized edema: Secondary | ICD-10-CM

## 2024-01-07 MED ORDER — HYDROCHLOROTHIAZIDE 25 MG PO TABS: 25.0000 mg | ORAL_TABLET | ORAL | 2 refills | Status: AC | PRN

## 2024-04-26 NOTE — Progress Notes (Signed)
 MATHAN DARROCH                                          MRN: [id number]   04/26/2024   The [facility] Quality Team Specialist reviewed this patient medical record for the purposes of chart review for care gap closure. The following were reviewed: chart review for care gap closure-glycemic status assessment.    [facility]BCI Quality Team
